# Patient Record
Sex: Male | Born: 1975
Health system: Southern US, Community
[De-identification: ages and names within clinical notes are randomized; demographics above are authoritative.]

## PROBLEM LIST (undated history)

## (undated) DIAGNOSIS — J381 Polyp of vocal cord and larynx: Secondary | ICD-10-CM

## (undated) DIAGNOSIS — F419 Anxiety disorder, unspecified: Secondary | ICD-10-CM

## (undated) HISTORY — DX: Polyp of vocal cord and larynx: J38.1

## (undated) HISTORY — DX: Anxiety disorder, unspecified: F41.9

## (undated) HISTORY — PX: FOOT FRACTURE SURGERY: SHX645

## (undated) HISTORY — PX: MINOR HEMORRHOIDECTOMY: SHX6238

## (undated) DEATH — deceased

---

## 2001-07-05 ENCOUNTER — Encounter: Payer: Self-pay | Admitting: Emergency Medicine

## 2001-07-05 ENCOUNTER — Emergency Department (HOSPITAL_COMMUNITY): Admission: EM | Admit: 2001-07-05 | Discharge: 2001-07-05 | Payer: Self-pay | Admitting: Emergency Medicine

## 2001-09-11 ENCOUNTER — Emergency Department (HOSPITAL_COMMUNITY): Admission: EM | Admit: 2001-09-11 | Discharge: 2001-09-11 | Payer: Self-pay | Admitting: Emergency Medicine

## 2001-09-11 ENCOUNTER — Encounter: Payer: Self-pay | Admitting: Emergency Medicine

## 2001-09-30 ENCOUNTER — Emergency Department (HOSPITAL_COMMUNITY): Admission: EM | Admit: 2001-09-30 | Discharge: 2001-10-01 | Payer: Self-pay | Admitting: *Deleted

## 2001-10-01 ENCOUNTER — Encounter: Payer: Self-pay | Admitting: *Deleted

## 2001-10-02 ENCOUNTER — Emergency Department (HOSPITAL_COMMUNITY): Admission: EM | Admit: 2001-10-02 | Discharge: 2001-10-02 | Payer: Self-pay | Admitting: Emergency Medicine

## 2001-10-07 ENCOUNTER — Emergency Department (HOSPITAL_COMMUNITY): Admission: EM | Admit: 2001-10-07 | Discharge: 2001-10-08 | Payer: Self-pay | Admitting: Emergency Medicine

## 2001-10-11 ENCOUNTER — Encounter: Payer: Self-pay | Admitting: Internal Medicine

## 2001-10-11 ENCOUNTER — Ambulatory Visit (HOSPITAL_COMMUNITY): Admission: RE | Admit: 2001-10-11 | Discharge: 2001-10-11 | Payer: Self-pay | Admitting: Internal Medicine

## 2001-10-18 ENCOUNTER — Encounter (HOSPITAL_COMMUNITY): Admission: RE | Admit: 2001-10-18 | Discharge: 2001-11-17 | Payer: Self-pay | Admitting: Internal Medicine

## 2002-04-08 ENCOUNTER — Emergency Department (HOSPITAL_COMMUNITY): Admission: EM | Admit: 2002-04-08 | Discharge: 2002-04-08 | Payer: Self-pay | Admitting: Emergency Medicine

## 2002-04-08 ENCOUNTER — Encounter: Payer: Self-pay | Admitting: Emergency Medicine

## 2002-04-27 ENCOUNTER — Emergency Department (HOSPITAL_COMMUNITY): Admission: EM | Admit: 2002-04-27 | Discharge: 2002-04-27 | Payer: Self-pay | Admitting: Emergency Medicine

## 2002-10-31 ENCOUNTER — Emergency Department (HOSPITAL_COMMUNITY): Admission: EM | Admit: 2002-10-31 | Discharge: 2002-10-31 | Payer: Self-pay | Admitting: Emergency Medicine

## 2003-09-13 ENCOUNTER — Emergency Department (HOSPITAL_COMMUNITY): Admission: EM | Admit: 2003-09-13 | Discharge: 2003-09-13 | Payer: Self-pay | Admitting: Emergency Medicine

## 2004-08-02 ENCOUNTER — Emergency Department (HOSPITAL_COMMUNITY): Admission: EM | Admit: 2004-08-02 | Discharge: 2004-08-02 | Payer: Self-pay | Admitting: Emergency Medicine

## 2005-05-26 ENCOUNTER — Emergency Department (HOSPITAL_COMMUNITY): Admission: EM | Admit: 2005-05-26 | Discharge: 2005-05-26 | Payer: Self-pay | Admitting: Emergency Medicine

## 2005-05-28 ENCOUNTER — Emergency Department (HOSPITAL_COMMUNITY): Admission: EM | Admit: 2005-05-28 | Discharge: 2005-05-28 | Payer: Self-pay | Admitting: Emergency Medicine

## 2006-02-17 ENCOUNTER — Emergency Department (HOSPITAL_COMMUNITY): Admission: EM | Admit: 2006-02-17 | Discharge: 2006-02-17 | Payer: Self-pay | Admitting: Family Medicine

## 2006-07-12 ENCOUNTER — Emergency Department (HOSPITAL_COMMUNITY): Admission: EM | Admit: 2006-07-12 | Discharge: 2006-07-12 | Payer: Self-pay | Admitting: Emergency Medicine

## 2006-07-17 ENCOUNTER — Ambulatory Visit (HOSPITAL_COMMUNITY): Admission: RE | Admit: 2006-07-17 | Discharge: 2006-07-17 | Payer: Self-pay | Admitting: Family Medicine

## 2006-10-05 ENCOUNTER — Emergency Department (HOSPITAL_COMMUNITY): Admission: EM | Admit: 2006-10-05 | Discharge: 2006-10-05 | Payer: Self-pay | Admitting: Emergency Medicine
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
## 2008-11-26 ENCOUNTER — Encounter: Payer: Self-pay | Admitting: Gastroenterology

## 2009-04-25 HISTORY — PX: FEMUR FRACTURE SURGERY: SHX633

## 2010-02-02 ENCOUNTER — Ambulatory Visit: Payer: Self-pay | Admitting: Physical Medicine & Rehabilitation

## 2010-02-02 ENCOUNTER — Inpatient Hospital Stay (HOSPITAL_COMMUNITY)
Admission: RE | Admit: 2010-02-02 | Discharge: 2010-02-10 | Payer: Self-pay | Admitting: Physical Medicine & Rehabilitation

## 2010-03-29 ENCOUNTER — Ambulatory Visit: Payer: Self-pay | Admitting: Orthopedic Surgery

## 2010-03-29 DIAGNOSIS — M5430 Sciatica, unspecified side: Secondary | ICD-10-CM

## 2010-03-29 DIAGNOSIS — M25569 Pain in unspecified knee: Secondary | ICD-10-CM | POA: Insufficient documentation

## 2010-03-29 DIAGNOSIS — S72309A Unspecified fracture of shaft of unspecified femur, initial encounter for closed fracture: Secondary | ICD-10-CM

## 2010-04-30 ENCOUNTER — Encounter (HOSPITAL_COMMUNITY)
Admission: RE | Admit: 2010-04-30 | Discharge: 2010-05-25 | Payer: Self-pay | Source: Home / Self Care | Attending: Obstetrics and Gynecology | Admitting: Obstetrics and Gynecology

## 2010-05-05 ENCOUNTER — Ambulatory Visit
Admission: RE | Admit: 2010-05-05 | Discharge: 2010-05-05 | Payer: Self-pay | Source: Home / Self Care | Attending: Orthopedic Surgery | Admitting: Orthopedic Surgery

## 2010-05-18 ENCOUNTER — Encounter: Payer: Self-pay | Admitting: Orthopedic Surgery

## 2010-05-24 ENCOUNTER — Encounter: Payer: Self-pay | Admitting: Gastroenterology

## 2010-05-27 ENCOUNTER — Encounter (HOSPITAL_COMMUNITY)
Admission: RE | Admit: 2010-05-27 | Discharge: 2010-05-27 | Disposition: A | Discharge status: Home / Self Care | Payer: 59 | Source: Ambulatory Visit | Attending: Rehabilitation | Admitting: Rehabilitation

## 2010-05-27 DIAGNOSIS — R29898 Other symptoms and signs involving the musculoskeletal system: Secondary | ICD-10-CM | POA: Insufficient documentation

## 2010-05-27 DIAGNOSIS — R262 Difficulty in walking, not elsewhere classified: Secondary | ICD-10-CM | POA: Insufficient documentation

## 2010-05-27 DIAGNOSIS — S7290XD Unspecified fracture of unspecified femur, subsequent encounter for closed fracture with routine healing: Secondary | ICD-10-CM | POA: Insufficient documentation

## 2010-05-27 DIAGNOSIS — IMO0001 Reserved for inherently not codable concepts without codable children: Secondary | ICD-10-CM | POA: Insufficient documentation
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
## 2010-05-27 NOTE — Miscellaneous (Signed)
Summary: Pt cl;inical evaluation  Pt cl;inical evaluation   Imported By: Jacklynn Ganong 05/21/2010 12:24:31  _____________________________________________________________________  External Attachment:    Type:   Image     Comment:   External Document

## 2010-05-27 NOTE — Letter (Signed)
Summary: History form  History form   Imported By: Jacklynn Ganong 03/31/2010 13:25:28  _____________________________________________________________________  External Attachment:    Type:   Image     Comment:   External Document
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
## 2010-05-31 ENCOUNTER — Ambulatory Visit (HOSPITAL_COMMUNITY)
Admission: RE | Admit: 2010-05-31 | Discharge: 2010-05-31 | Disposition: A | Discharge status: Home / Self Care | Payer: 59 | Source: Ambulatory Visit | Attending: Orthopedic Surgery | Admitting: Orthopedic Surgery

## 2010-05-31 DIAGNOSIS — R269 Unspecified abnormalities of gait and mobility: Secondary | ICD-10-CM | POA: Insufficient documentation

## 2010-05-31 DIAGNOSIS — M6281 Muscle weakness (generalized): Secondary | ICD-10-CM | POA: Insufficient documentation

## 2010-05-31 DIAGNOSIS — IMO0001 Reserved for inherently not codable concepts without codable children: Secondary | ICD-10-CM | POA: Insufficient documentation

## 2010-06-02 NOTE — Letter (Signed)
Summary: refill request  refill request   Imported By: Ave Filter 05/24/2010 16:13:45  _____________________________________________________________________  External Attachment:    Type:   Image     Comment:   External Document

## 2010-06-04 ENCOUNTER — Ambulatory Visit (HOSPITAL_COMMUNITY)
Admission: RE | Admit: 2010-06-04 | Discharge: 2010-06-04 | Disposition: A | Discharge status: Home / Self Care | Payer: 59 | Source: Ambulatory Visit | Attending: Rehabilitation | Admitting: Rehabilitation

## 2010-06-04 DIAGNOSIS — R262 Difficulty in walking, not elsewhere classified: Secondary | ICD-10-CM | POA: Insufficient documentation

## 2010-06-04 DIAGNOSIS — IMO0001 Reserved for inherently not codable concepts without codable children: Secondary | ICD-10-CM | POA: Insufficient documentation

## 2010-06-04 DIAGNOSIS — M79609 Pain in unspecified limb: Secondary | ICD-10-CM | POA: Insufficient documentation

## 2010-06-04 DIAGNOSIS — M6281 Muscle weakness (generalized): Secondary | ICD-10-CM | POA: Insufficient documentation

## 2010-06-04 DIAGNOSIS — M25659 Stiffness of unspecified hip, not elsewhere classified: Secondary | ICD-10-CM | POA: Insufficient documentation

## 2010-06-04 DIAGNOSIS — R269 Unspecified abnormalities of gait and mobility: Secondary | ICD-10-CM | POA: Insufficient documentation

## 2010-06-09 ENCOUNTER — Ambulatory Visit (HOSPITAL_COMMUNITY)
Admission: RE | Admit: 2010-06-09 | Discharge: 2010-06-09 | Disposition: A | Discharge status: Home / Self Care | Payer: 59 | Source: Ambulatory Visit | Attending: Orthopedic Surgery | Admitting: Orthopedic Surgery

## 2010-06-09 DIAGNOSIS — R262 Difficulty in walking, not elsewhere classified: Secondary | ICD-10-CM | POA: Insufficient documentation

## 2010-06-09 DIAGNOSIS — M6281 Muscle weakness (generalized): Secondary | ICD-10-CM | POA: Insufficient documentation

## 2010-06-09 DIAGNOSIS — M79609 Pain in unspecified limb: Secondary | ICD-10-CM | POA: Insufficient documentation

## 2010-06-09 DIAGNOSIS — IMO0001 Reserved for inherently not codable concepts without codable children: Secondary | ICD-10-CM | POA: Insufficient documentation

## 2010-06-10 ENCOUNTER — Ambulatory Visit (HOSPITAL_COMMUNITY)
Admission: RE | Admit: 2010-06-10 | Discharge: 2010-06-10 | Disposition: A | Discharge status: Home / Self Care | Payer: 59 | Source: Ambulatory Visit | Attending: *Deleted | Admitting: *Deleted

## 2010-06-10 ENCOUNTER — Encounter: Payer: Self-pay | Admitting: Orthopedic Surgery

## 2010-06-15 ENCOUNTER — Encounter: Payer: Self-pay | Admitting: Urgent Care

## 2010-06-16 ENCOUNTER — Ambulatory Visit (HOSPITAL_COMMUNITY): Payer: 59 | Admitting: Physical Therapy

## 2010-06-16 ENCOUNTER — Ambulatory Visit: Payer: Self-pay | Admitting: Orthopedic Surgery

## 2010-06-18 ENCOUNTER — Ambulatory Visit (HOSPITAL_COMMUNITY)
Admission: RE | Admit: 2010-06-18 | Discharge: 2010-06-18 | Disposition: A | Discharge status: Home / Self Care | Payer: 59 | Source: Ambulatory Visit

## 2010-06-21 ENCOUNTER — Ambulatory Visit (HOSPITAL_COMMUNITY)
Admission: RE | Admit: 2010-06-21 | Discharge: 2010-06-21 | Disposition: A | Discharge status: Home / Self Care | Payer: 59 | Source: Ambulatory Visit | Attending: *Deleted | Admitting: *Deleted

## 2010-06-22 ENCOUNTER — Ambulatory Visit (HOSPITAL_COMMUNITY)
Admission: RE | Admit: 2010-06-22 | Discharge: 2010-06-22 | Disposition: A | Discharge status: Home / Self Care | Payer: 59 | Source: Ambulatory Visit | Attending: *Deleted | Admitting: *Deleted

## 2010-06-22 NOTE — Medication Information (Signed)
Summary: CYCLOBENZAPRINE 10MG   CYCLOBENZAPRINE 10MG    Imported By: Rexene Alberts 06/15/2010 09:22:56  _____________________________________________________________________  External Attachment:    Type:   Image     Comment:   External Document  Appended Document: CYCLOBENZAPRINE 10MG  Please call pharm.  I think this should go to Dr Harrison's maybe?  Appended Document: CYCLOBENZAPRINE 10MG  informed pharmacy

## 2010-06-22 NOTE — Miscellaneous (Signed)
Summary: Rehab Report  Rehab Report   Imported By: Cammie Sickle 06/16/2010 16:08:07  _____________________________________________________________________  External Attachment:    Type:   Image     Comment:   External Document

## 2010-06-24 ENCOUNTER — Encounter: Payer: Self-pay | Admitting: Orthopedic Surgery

## 2010-06-28 ENCOUNTER — Encounter: Payer: Self-pay | Admitting: Orthopedic Surgery

## 2010-06-28 ENCOUNTER — Ambulatory Visit (INDEPENDENT_AMBULATORY_CARE_PROVIDER_SITE_OTHER): Payer: 59 | Admitting: Orthopedic Surgery

## 2010-06-28 DIAGNOSIS — S72309A Unspecified fracture of shaft of unspecified femur, initial encounter for closed fracture: Secondary | ICD-10-CM

## 2010-06-30 ENCOUNTER — Ambulatory Visit: Payer: Self-pay | Admitting: Orthopedic Surgery

## 2010-07-05 ENCOUNTER — Ambulatory Visit (HOSPITAL_COMMUNITY)
Admission: RE | Admit: 2010-07-05 | Discharge: 2010-07-05 | Disposition: A | Discharge status: Home / Self Care | Payer: 59 | Source: Ambulatory Visit | Attending: Rehabilitation | Admitting: Rehabilitation

## 2010-07-05 DIAGNOSIS — M79609 Pain in unspecified limb: Secondary | ICD-10-CM | POA: Insufficient documentation

## 2010-07-05 DIAGNOSIS — M6281 Muscle weakness (generalized): Secondary | ICD-10-CM | POA: Insufficient documentation

## 2010-07-05 DIAGNOSIS — IMO0001 Reserved for inherently not codable concepts without codable children: Secondary | ICD-10-CM | POA: Insufficient documentation

## 2010-07-05 DIAGNOSIS — R262 Difficulty in walking, not elsewhere classified: Secondary | ICD-10-CM | POA: Insufficient documentation
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
## 2010-07-06 NOTE — Miscellaneous (Signed)
Summary: Dr. Dwana Melena  Treating physician at [facility]  Appended Document: Dr. Dwana Melena [phone]

## 2010-07-07 ENCOUNTER — Ambulatory Visit (HOSPITAL_COMMUNITY)
Admission: RE | Admit: 2010-07-07 | Discharge: 2010-07-07 | Disposition: A | Discharge status: Home / Self Care | Payer: 59 | Source: Ambulatory Visit | Attending: *Deleted | Admitting: *Deleted

## 2010-07-07 LAB — CULTURE, BLOOD (ROUTINE X 2): Culture  Setup Time: 201110120532

## 2010-07-07 LAB — COMPREHENSIVE METABOLIC PANEL
Alkaline Phosphatase: 88 U/L (ref 39–117)
CO2: 26 mEq/L (ref 19–32)
Calcium: 9.1 mg/dL (ref 8.4–10.5)
GFR calc Af Amer: >60 mL/min (ref >60)
Sodium: 134 mEq/L — ABNORMAL LOW (ref 135–145)
Total Bilirubin: 1.2 mg/dL (ref 0.3–1.2)

## 2010-07-07 LAB — URINE CULTURE
Colony Count: 60000
Culture  Setup Time: 201110120959

## 2010-07-07 LAB — HEPATIC FUNCTION PANEL
ALT: 96 U/L — ABNORMAL HIGH (ref 0–53)
AST: 46 U/L — ABNORMAL HIGH (ref 0–37)
Albumin: 3.1 g/dL — ABNORMAL LOW (ref 3.5–5.2)

## 2010-07-07 LAB — CBC
Hemoglobin: 10 g/dL — ABNORMAL LOW (ref 13.0–17.0)
MCHC: 33.3 g/dL (ref 30.0–36.0)
RBC: 3.36 MIL/uL — ABNORMAL LOW (ref 4.22–5.81)

## 2010-07-07 LAB — URINALYSIS, MICROSCOPIC ONLY
Bilirubin Urine: NEGATIVE
Glucose, UA: NEGATIVE mg/dL
Ketones, ur: NEGATIVE mg/dL
pH: 6.5 (ref 5.0–8.0)

## 2010-07-07 LAB — DIFFERENTIAL
Basophils Absolute: 0 10*3/uL (ref 0.0–0.1)
Basophils Relative: 0 % (ref 0–1)
Lymphocytes Relative: 23 % (ref 12–46)
Neutro Abs: 6.8 10*3/uL (ref 1.7–7.7)
Neutrophils Relative %: 68 % (ref 43–77)

## 2010-07-26 ENCOUNTER — Ambulatory Visit (HOSPITAL_COMMUNITY)
Admission: RE | Admit: 2010-07-26 | Discharge: 2010-07-26 | Disposition: A | Discharge status: Home / Self Care | Payer: 59 | Source: Ambulatory Visit | Attending: Rehabilitation | Admitting: Rehabilitation

## 2010-07-26 DIAGNOSIS — M79609 Pain in unspecified limb: Secondary | ICD-10-CM | POA: Insufficient documentation

## 2010-07-26 DIAGNOSIS — R262 Difficulty in walking, not elsewhere classified: Secondary | ICD-10-CM | POA: Insufficient documentation

## 2010-07-26 DIAGNOSIS — IMO0001 Reserved for inherently not codable concepts without codable children: Secondary | ICD-10-CM | POA: Insufficient documentation

## 2010-07-26 DIAGNOSIS — M6281 Muscle weakness (generalized): Secondary | ICD-10-CM | POA: Insufficient documentation

## 2010-08-02 ENCOUNTER — Ambulatory Visit (HOSPITAL_COMMUNITY)
Admission: RE | Admit: 2010-08-02 | Discharge: 2010-08-02 | Disposition: A | Discharge status: Home / Self Care | Payer: 59 | Source: Ambulatory Visit | Attending: *Deleted | Admitting: *Deleted

## 2010-08-03 ENCOUNTER — Ambulatory Visit (INDEPENDENT_AMBULATORY_CARE_PROVIDER_SITE_OTHER): Payer: 59 | Admitting: Orthopedic Surgery

## 2010-08-03 DIAGNOSIS — S72309A Unspecified fracture of shaft of unspecified femur, initial encounter for closed fracture: Secondary | ICD-10-CM

## 2010-08-03 NOTE — Patient Instructions (Signed)
Take ibuprofen 800 mg up to 3 x a day

## 2010-08-03 NOTE — Discharge Summary (Signed)
Identifiable x-ray report.  AP, lateral on fluoroscopy films, RIGHT femur.  Reason for x-ray followup for RIGHT femur fracture with intramedullary nailing.  Previous films are available for comparison.  There is a midshaft femur fracture with callous. It appears to be consolidating. The proximal and distal screws have not migrated and are in good position. Overall alignment near anatomic.  Impression healing midshaft femur fracture

## 2010-08-03 NOTE — Progress Notes (Signed)
6 status post IM nail, RIGHT femur.  Complains of mild pain in his back, hip, and knee.  He says he is now only taking Tylenol with an occasional narcotic. He says he felt like he may have been getting dependent on them and stop taking and thought he might have had some withdrawal symptoms.  He also stopped taking the Neurontin.  He tried to do some running, which didn't go to well. He is Progressing well in physical therapy. He still has a slight limp.  He is tender over his lower back and also over the greater trochanter of the RIGHT hip. He maintains his good knee flexion and good quadriceps muscle tone.  Radiographs were obtained. There is fracture callus around the fracture. The fracture is not completely consolidated yet.  Recommend continued weightbearing, stopped running. Progress to walking 30 minutes consecutive. The elliptical trainer is okay.  I did talk to his physician, surgeon after his last visit, and she said that dynamization is a possibility, if needed at 9 months.  Return in 3 months x-rays repeat.

## 2010-08-05 ENCOUNTER — Ambulatory Visit (HOSPITAL_COMMUNITY): Payer: 59 | Admitting: Physical Therapy

## 2010-08-09 ENCOUNTER — Ambulatory Visit (HOSPITAL_COMMUNITY)
Admission: RE | Admit: 2010-08-09 | Discharge: 2010-08-09 | Disposition: A | Discharge status: Home / Self Care | Payer: 59 | Source: Ambulatory Visit | Attending: Rehabilitation | Admitting: Rehabilitation

## 2010-08-11 ENCOUNTER — Ambulatory Visit (HOSPITAL_COMMUNITY): Payer: 59 | Admitting: *Deleted

## 2010-08-13 ENCOUNTER — Ambulatory Visit (HOSPITAL_COMMUNITY)
Admission: RE | Admit: 2010-08-13 | Discharge: 2010-08-13 | Disposition: A | Discharge status: Home / Self Care | Payer: 59 | Source: Ambulatory Visit | Attending: Rehabilitation | Admitting: Rehabilitation

## 2010-08-13 DIAGNOSIS — IMO0001 Reserved for inherently not codable concepts without codable children: Secondary | ICD-10-CM | POA: Insufficient documentation

## 2010-08-13 DIAGNOSIS — M6281 Muscle weakness (generalized): Secondary | ICD-10-CM | POA: Insufficient documentation

## 2010-08-13 DIAGNOSIS — R262 Difficulty in walking, not elsewhere classified: Secondary | ICD-10-CM | POA: Insufficient documentation

## 2010-08-13 DIAGNOSIS — M79609 Pain in unspecified limb: Secondary | ICD-10-CM | POA: Insufficient documentation

## 2010-08-16 ENCOUNTER — Ambulatory Visit (HOSPITAL_COMMUNITY)
Admission: RE | Admit: 2010-08-16 | Discharge: 2010-08-16 | Disposition: A | Discharge status: Home / Self Care | Payer: 59 | Source: Ambulatory Visit | Attending: Rehabilitation | Admitting: Rehabilitation

## 2010-11-09 ENCOUNTER — Ambulatory Visit (INDEPENDENT_AMBULATORY_CARE_PROVIDER_SITE_OTHER): Payer: 59 | Admitting: Orthopedic Surgery

## 2010-11-09 DIAGNOSIS — S72309A Unspecified fracture of shaft of unspecified femur, initial encounter for closed fracture: Secondary | ICD-10-CM

## 2010-11-09 NOTE — Progress Notes (Signed)
   Fracture care, followup.  The patient is doing well, has a mid femur, aching, which is occasional.  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 He is anxious to start some jogging.  X-rays today show fracture bridging across the fracture site on both views. Pretty much healed femur fracture. At this point.  Exam shows excellent hip flexion, knee flexion, and stable. Knee exam. No tenderness to palpation at the fracture site.  Recommend followup for one year x-ray if things are going well at that time, we should be able to stop.  Separate identifiable. X-ray report AP, lateral, RIGHT femur took 4 views. Bridging callus is noted over the previously noted, femur fracture, healing is good alignment is good. Hardware is in good position.  Impression healing femur fracture.

## 2010-11-09 NOTE — Progress Notes (Signed)
Separate identifiable. X-ray report AP, lateral, RIGHT femur took 4 views. Bridging callus is noted over the previously noted, femur fracture, healing is good alignment is good. Hardware is in good position.  Impression healing femur fracture.

## 2010-12-07 ENCOUNTER — Other Ambulatory Visit: Payer: Self-pay | Admitting: *Deleted

## 2010-12-07 ENCOUNTER — Other Ambulatory Visit: Payer: Self-pay

## 2010-12-07 DIAGNOSIS — M791 Myalgia, unspecified site: Secondary | ICD-10-CM

## 2010-12-07 MED ORDER — CYCLOBENZAPRINE HCL 10 MG PO TABS: 10.0000 mg | ORAL_TABLET | Freq: Three times a day (TID) | ORAL | Status: AC | PRN

## 2011-02-10 ENCOUNTER — Ambulatory Visit (INDEPENDENT_AMBULATORY_CARE_PROVIDER_SITE_OTHER): Payer: 59 | Admitting: Orthopedic Surgery

## 2011-02-10 ENCOUNTER — Encounter: Payer: Self-pay | Admitting: Orthopedic Surgery

## 2011-02-10 VITALS — BP 130/64 | Ht 75.5 in | Wt 262.4 lb

## 2011-02-10 DIAGNOSIS — S72309A Unspecified fracture of shaft of unspecified femur, initial encounter for closed fracture: Secondary | ICD-10-CM

## 2011-02-10 NOTE — Patient Instructions (Signed)
Gradually increase activity as tolerated

## 2011-02-11 ENCOUNTER — Encounter: Payer: Self-pay | Admitting: Orthopedic Surgery
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
## 2011-12-07 ENCOUNTER — Telehealth: Payer: Self-pay | Admitting: Gastroenterology

## 2011-12-07 MED ORDER — HYDROCORTISONE ACETATE 25 MG RE SUPP: 25.0000 mg | Freq: Two times a day (BID) | RECTAL | Status: AC

## 2011-12-07 NOTE — Telephone Encounter (Signed)
PT HAVING RECTAL BLEEDING. REQUESTING SUPPOSITORIES.

## 2011-12-21 ENCOUNTER — Other Ambulatory Visit: Payer: Self-pay | Admitting: Gastroenterology

## 2011-12-21 ENCOUNTER — Telehealth: Payer: Self-pay | Admitting: Gastroenterology

## 2011-12-21 DIAGNOSIS — K625 Hemorrhage of anus and rectum: Secondary | ICD-10-CM

## 2011-12-21 DIAGNOSIS — K649 Unspecified hemorrhoids: Secondary | ICD-10-CM

## 2011-12-21 NOTE — Telephone Encounter (Signed)
REVIEWED.  

## 2011-12-21 NOTE — Telephone Encounter (Signed)
PT USING ANUSOL SUPP AND CONTINUE W/ BRBPR AND ITCHING. DESIRES HEMORRHOID BANDING. SCHEDULE FLEX SIG/BANDING 9/3 AROUND 10 AM. NEEDS HEPLOCK ONLY.

## 2011-12-21 NOTE — Telephone Encounter (Signed)
PATIENT IS SCHEDULED AT 10:15 W/SLF ON 09/03

## 2011-12-22 ENCOUNTER — Encounter (HOSPITAL_COMMUNITY): Payer: Self-pay | Admitting: Pharmacy Technician

## 2011-12-22 ENCOUNTER — Other Ambulatory Visit: Payer: Self-pay | Admitting: Gastroenterology

## 2011-12-27 ENCOUNTER — Encounter (HOSPITAL_COMMUNITY): Admission: RE | Payer: Self-pay | Source: Ambulatory Visit

## 2011-12-27 ENCOUNTER — Ambulatory Visit (HOSPITAL_COMMUNITY): Admission: RE | Admit: 2011-12-27 | Payer: 59 | Source: Ambulatory Visit | Admitting: Gastroenterology

## 2011-12-27 SURGERY — SIGMOIDOSCOPY, FLEXIBLE
Anesthesia: Moderate Sedation

## 2013-10-01 ENCOUNTER — Telehealth: Payer: Self-pay | Admitting: Gastroenterology

## 2013-10-01 MED ORDER — NYSTATIN 100000 UNIT/ML MT SUSP: 5.0000 mL | Freq: Four times a day (QID) | OROMUCOSAL | Status: DC

## 2013-10-01 NOTE — Telephone Encounter (Signed)
PT HAVING PROBLEMS WITH GEOGRAPHIC TONGUE. REQUESTING NYASTATIN MW.

## 2014-07-25 NOTE — H&P (Signed)
  NTS SOAP Note  Vital Signs:  Vitals as of: 1/79/1505: Systolic 697: Diastolic 88: Heart Rate 66: Temp 66F: Height 96ft 3in: Weight 268Lbs 0 Ounces: BMI 33.5  BMI : 33.5 kg/m2  Subjective: This 39 year old male presents for of a thrombosed hemorrhoid.  Has been swollen and tender for several days now.  No blood per rectum.  This has been recurrent in nature.  s/p hemorrhoidal banding in the past.  Review of Symptoms:  Constitutional:unremarkable   Head:unremarkable Eyes:unremarkable   Nose/Mouth/Throat:unremarkable Cardiovascular:  unremarkable Respiratory:unremarkable Gastrointestinal:  unremarkable   Genitourinary:unremarkable   Musculoskeletal:unremarkable Skin:unremarkable Hematolgic/Lymphatic:unremarkable   Allergic/Immunologic:unremarkable   Past Medical History:  Reviewed  Past Medical History  Surgical History: banding of internal hemorrhoids 2013, broken femur Medical Problems: none Allergies: nkda Medications: none   Social History:Reviewed  Social History  Preferred Language: English Race:  Black or African American Ethnicity: Not Hispanic / Latino Age: 39 Years 0 Months Marital Status:  M Alcohol: rarely Recreational drug(s):  No   Smoking Status: Never smoker reviewed on 07/24/2014 Functional Status reviewed on 07/24/2014 ------------------------------------------------ Bathing: Normal Cooking: Normal Dressing: Normal Driving: Normal Eating: Normal Managing Meds: Normal Oral Care: Normal Shopping: Normal Toileting: Normal Transferring: Normal Walking: Normal Cognitive Status reviewed on 07/24/2014 ------------------------------------------------ Attention: Normal Decision Making: Normal Language: Normal Memory: Normal Motor: Normal Perception: Normal Problem Solving: Normal Visual and Spatial: Normal   Family History:Reviewed  Family Health History Family History is Unknown    Objective  Information: General:Well appearing, well nourished in no distress. Heart:RRR, no murmur Lungs:  CTA bilaterally, no wheezes, rhonchi, rales.  Breathing unlabored. Thrombosed external hemorrhoid noted at 8 o'clock position,  associated skin tag also present.  Assessment:Thrombosed external hemorrhoid  Diagnoses: 455.4  K64.5 Thrombosed external hemorrhoids (Perianal venous thrombosis)  Procedures: 94801 - UNLISTED EVALUATION AND MANAGEMENT SERVICE    Plan:  Scheduled for hemorrhoidectomy on 08/04/14.   Patient Education:Alternative treatments to surgery were discussed with patient (and family).  Risks and benefits  of procedure were fully explained to the patient (and family) who gave informed consent. Patient/family questions were addressed.  Follow-up:Pending Surgery

## 2014-07-29 NOTE — Patient Instructions (Signed)
Gregory Baker  07/29/2014   Your procedure is scheduled on:  08/04/2014  Report to [facility] at 6:15 AM.  Call this number if you have problems the morning of surgery: [phone]   Remember:   Do not eat food or drink liquids after midnight.   Take these medicines the morning of surgery with A SIP OF WATER: N/A   Do not wear jewelry, make-up or nail polish.  Do not wear lotions, powders, or perfumes. You may wear deodorant.  Do not shave 48 hours prior to surgery. Men may shave face and neck.  Do not bring valuables to the hospital.  [facility] is not responsible for any belongings or valuables.               Contacts, dentures or bridgework may not be worn into surgery.  Leave suitcase in the car. After surgery it may be brought to your room.  For patients admitted to the hospital, discharge time is determined by your treatment team.               Patients discharged the day of surgery will not be allowed to drive home.  Name and phone number of your driver:   Special Instructions: Shower using CHG 2 nights before surgery and the night before surgery.  If you shower the day of surgery use CHG.  Use special wash - you have one bottle of CHG for all showers.  You should use approximately 1/3 of the bottle for each shower.   Please read over the following fact sheets that you were given: Surgical Site Infection Prevention and Anesthesia Post-op Instructions   PATIENT INSTRUCTIONS POST-ANESTHESIA  IMMEDIATELY FOLLOWING SURGERY:  Do not drive or operate machinery for the first twenty four hours after surgery.  Do not make any important decisions for twenty four hours after surgery or while taking narcotic pain medications or sedatives.  If you develop intractable nausea and vomiting or a severe headache please notify your doctor immediately.  FOLLOW-UP:  Please make an appointment with your surgeon as instructed. You do not need to follow up with anesthesia unless specifically  instructed to do so.  WOUND CARE INSTRUCTIONS (if applicable):  Keep a dry clean dressing on the anesthesia/puncture wound site if there is drainage.  Once the wound has quit draining you may leave it open to air.  Generally you should leave the bandage intact for twenty four hours unless there is drainage.  If the epidural site drains for more than 36-48 hours please call the anesthesia department.  QUESTIONS?:  Please feel free to call your physician or the hospital operator if you have any questions, and they will be happy to assist you.      Hemorrhoidectomy Hemorrhoidectomy is surgery to remove hemorrhoids. Hemorrhoids are veins that have become swollen in the rectum. The rectum is the area from the bottom end of the intestines to the opening where bowel movements leave the body. Hemorrhoids can be uncomfortable. They can cause itching, bleeding and pain if a blood clot forms in them (thrombose). If hemorrhoids are small, surgery may not be needed. But if they cover a larger area, surgery is usually suggested.  LET YOUR CAREGIVER KNOW ABOUT:   Any allergies.  All medications you are taking, including:  Herbs, eyedrops, over-the-counter medications and creams.  Blood thinners (anticoagulants), aspirin or other drugs that could affect blood clotting.  Use of steroids (by mouth or as creams).  Previous problems with anesthetics, including  local anesthetics.  Possibility of pregnancy, if this applies.  Any history of blood clots.  Any history of bleeding or other blood problems.  Previous surgery.  Smoking history.  Other health problems. RISKS AND COMPLICATIONS All surgery carries some risk. However, hemorrhoid surgery usually goes smoothly. Possible complications could include:  Urinary retention.  Bleeding.  Infection.  A painful incision.  A reaction to the anesthesia (this is not common). BEFORE THE PROCEDURE   Stop using aspirin and non-steroidal  anti-inflammatory drugs (NSAIDs) for pain relief. This includes prescription drugs and over-the-counter drugs such as ibuprofen and naproxen. Also stop taking vitamin E. If possible, do this two weeks before your surgery.  If you take blood-thinners, ask your healthcare provider when you should stop taking them.  You will probably have blood and urine tests done several days before your surgery.  Do not eat or drink for about 8 hours before the surgery.  Arrive at least an hour before the surgery, or whenever your surgeon recommends. This will give you time to check in and fill out any needed paperwork.  Hemorrhoidectomy is often an outpatient procedure. This means you will be able to go home the same day. Sometimes, though, people stay overnight in the hospital after the procedure. Ask your surgeon what to expect. Either way, make arrangements in advance for someone to drive you home. PROCEDURE   The preparation:  You will change into a hospital gown.  You will be given an IV. A needle will be inserted in your arm. Medication can flow directly into your body through this needle.  You might be given an enema to clear your rectum.  Once in the operating room, you will probably lie on your side or be repositioned later to lying on your stomach.  You will be given anesthesia (medication) so you will not feel anything during the surgery. The surgery often is done with local anesthesia (the area near the hemorrhoids will be numb and you will be drowsy but awake). Sometimes, general anesthesia is used (you will be asleep during the procedure).  The procedure:  There are a few different procedures for hemorrhoids. Be sure to ask you surgeon about the procedure, the risks and benefits.  Be sure to ask about what you need to do to take care of the wound, if there is one. AFTER THE PROCEDURE  You will stay in a recovery area until the anesthesia has worn off. Your blood pressure and pulse will  be checked every so often.  You may feel a lot of pain in the area of the rectum.  Take all pain medication prescribed by your surgeon. Ask before taking any over-the-counter pain medicines.  Sometimes sitting in a warm bath can help relieve your pain.  To make sure you have bowel movements without straining:  You will probably need to take stool softeners (usually a pill) for a few days.  You should drink 8 to 10 glasses of water each day.  Your activity will be restricted for awhile. Ask your caregiver for a list of what you should and should not do while you recover. Document Released: 02/06/2009 Document Revised: 07/04/2011 Document Reviewed: 02/06/2009 [facility] Patient Information 2015 [place], [place]. This information is not intended to replace advice given to you by your health care provider. Make sure you discuss any questions you have with your health care provider.

## 2014-07-31 ENCOUNTER — Encounter (HOSPITAL_COMMUNITY)
Admission: RE | Admit: 2014-07-31 | Discharge: 2014-07-31 | Disposition: A | Discharge status: Home / Self Care | Payer: 59 | Source: Ambulatory Visit | Attending: General Surgery | Admitting: General Surgery

## 2014-07-31 ENCOUNTER — Encounter (HOSPITAL_COMMUNITY): Payer: Self-pay

## 2014-07-31 DIAGNOSIS — Z01812 Encounter for preprocedural laboratory examination: Secondary | ICD-10-CM | POA: Diagnosis not present

## 2014-07-31 LAB — CBC WITH DIFFERENTIAL/PLATELET
Basophils Absolute: 0 10*3/uL (ref 0.0–0.1)
Basophils Relative: 0 % (ref 0–1)
EOS ABS: 0.1 10*3/uL (ref 0.0–0.7)
EOS PCT: 1 % (ref 0–5)
HCT: 44.5 % (ref 39.0–52.0)
Hemoglobin: 14.9 g/dL (ref 13.0–17.0)
Lymphocytes Relative: 39 % (ref 12–46)
Lymphs Abs: 2.3 10*3/uL (ref 0.7–4.0)
MCH: 30.7 pg (ref 26.0–34.0)
MCHC: 33.5 g/dL (ref 30.0–36.0)
MCV: 91.6 fL (ref 78.0–100.0)
MONOS PCT: 8 % (ref 3–12)
Monocytes Absolute: 0.5 10*3/uL (ref 0.1–1.0)
NEUTROS ABS: 3.1 10*3/uL (ref 1.7–7.7)
Neutrophils Relative %: 52 % (ref 43–77)
PLATELETS: 237 10*3/uL (ref 150–400)
RBC: 4.86 MIL/uL (ref 4.22–5.81)
RDW: 12.2 % (ref 11.5–15.5)
WBC: 6 10*3/uL (ref 4.0–10.5)

## 2014-07-31 LAB — BASIC METABOLIC PANEL
Anion gap: 7 (ref 5–15)
BUN: 16 mg/dL (ref 6–23)
CHLORIDE: 104 mmol/L (ref 96–112)
CO2: 25 mmol/L (ref 19–32)
Calcium: 9.5 mg/dL (ref 8.4–10.5)
Creatinine, Ser: 1.27 mg/dL (ref 0.50–1.35)
GFR, EST AFRICAN AMERICAN: 81 mL/min — AB (ref >90)
GFR, EST NON AFRICAN AMERICAN: 70 mL/min — AB (ref >90)
GLUCOSE: 96 mg/dL (ref 70–99)
Potassium: 4.1 mmol/L (ref 3.5–5.1)
Sodium: 136 mmol/L (ref 135–145)

## 2014-08-04 ENCOUNTER — Encounter (HOSPITAL_COMMUNITY): Payer: Self-pay

## 2014-08-04 ENCOUNTER — Encounter (HOSPITAL_COMMUNITY)
Admission: RE | Disposition: A | Discharge status: Home / Self Care | Payer: Self-pay | Source: Ambulatory Visit | Attending: General Surgery

## 2014-08-04 ENCOUNTER — Ambulatory Visit (HOSPITAL_COMMUNITY): Payer: 59 | Admitting: Anesthesiology

## 2014-08-04 ENCOUNTER — Ambulatory Visit (HOSPITAL_COMMUNITY)
Admission: RE | Admit: 2014-08-04 | Discharge: 2014-08-04 | Disposition: A | Discharge status: Home / Self Care | Payer: 59 | Source: Ambulatory Visit | Attending: General Surgery | Admitting: General Surgery

## 2014-08-04 DIAGNOSIS — K645 Perianal venous thrombosis: Secondary | ICD-10-CM | POA: Diagnosis not present

## 2014-08-04 HISTORY — PX: HEMORRHOID SURGERY: SHX153

## 2014-08-04 SURGERY — HEMORRHOIDECTOMY
Anesthesia: General

## 2014-08-04 MED ORDER — OXYCODONE-ACETAMINOPHEN 7.5-325 MG PO TABS: 1.0000 | ORAL_TABLET | ORAL | Status: DC | PRN

## 2014-08-04 MED ORDER — SODIUM CHLORIDE 0.9 % IR SOLN
Status: DC | PRN
  Administered 2014-08-04: 1000 mL

## 2014-08-04 MED ORDER — FENTANYL CITRATE 0.05 MG/ML IJ SOLN
INTRAMUSCULAR | Status: DC | PRN
  Administered 2014-08-04: 100 ug via INTRAVENOUS

## 2014-08-04 MED ORDER — LIDOCAINE HCL (CARDIAC) 10 MG/ML IV SOLN
INTRAVENOUS | Status: DC | PRN
  Administered 2014-08-04: 30 mg via INTRAVENOUS

## 2014-08-04 MED ORDER — GLYCOPYRROLATE 0.2 MG/ML IJ SOLN
0.2000 mg | Freq: Once | INTRAMUSCULAR | Status: AC
  Administered 2014-08-04: 0.2 mg via INTRAVENOUS

## 2014-08-04 MED ORDER — BUPIVACAINE HCL (PF) 0.5 % IJ SOLN
INTRAMUSCULAR | Status: AC
  Filled 2014-08-04: qty 30

## 2014-08-04 MED ORDER — LIDOCAINE HCL (PF) 1 % IJ SOLN
INTRAMUSCULAR | Status: AC
  Filled 2014-08-04: qty 5

## 2014-08-04 MED ORDER — MIDAZOLAM HCL 2 MG/2ML IJ SOLN
1.0000 mg | INTRAMUSCULAR | Status: DC | PRN
Start: 2014-08-04 — End: 2014-08-04
  Administered 2014-08-04: 2 mg via INTRAVENOUS

## 2014-08-04 MED ORDER — PROPOFOL 10 MG/ML IV BOLUS
INTRAVENOUS | Status: AC
  Filled 2014-08-04: qty 20

## 2014-08-04 MED ORDER — LIDOCAINE VISCOUS 2 % MT SOLN
OROMUCOSAL | Status: DC | PRN
  Administered 2014-08-04: 1

## 2014-08-04 MED ORDER — FENTANYL CITRATE 0.05 MG/ML IJ SOLN
INTRAMUSCULAR | Status: AC
  Filled 2014-08-04: qty 2

## 2014-08-04 MED ORDER — ONDANSETRON HCL 4 MG/2ML IJ SOLN: 4.0000 mg | Freq: Once | INTRAMUSCULAR | Status: DC | PRN

## 2014-08-04 MED ORDER — LACTATED RINGERS IV SOLN
INTRAVENOUS | Status: DC
  Administered 2014-08-04: 07:00:00 via INTRAVENOUS

## 2014-08-04 MED ORDER — KETOROLAC TROMETHAMINE 30 MG/ML IJ SOLN
30.0000 mg | Freq: Once | INTRAMUSCULAR | Status: AC
  Administered 2014-08-04: 30 mg via INTRAVENOUS

## 2014-08-04 MED ORDER — PROPOFOL 10 MG/ML IV BOLUS
INTRAVENOUS | Status: DC | PRN
  Administered 2014-08-04: 200 mg via INTRAVENOUS
  Administered 2014-08-04: 30 mg via INTRAVENOUS
  Administered 2014-08-04: 20 mg via INTRAVENOUS

## 2014-08-04 MED ORDER — METRONIDAZOLE IN NACL 5-0.79 MG/ML-% IV SOLN
500.0000 mg | INTRAVENOUS | Status: AC
  Administered 2014-08-04: 500 mg via INTRAVENOUS
  Filled 2014-08-04: qty 100

## 2014-08-04 MED ORDER — MIDAZOLAM HCL 2 MG/2ML IJ SOLN
INTRAMUSCULAR | Status: AC
  Filled 2014-08-04: qty 2

## 2014-08-04 MED ORDER — LIDOCAINE VISCOUS 2 % MT SOLN
OROMUCOSAL | Status: AC
  Filled 2014-08-04: qty 15

## 2014-08-04 MED ORDER — FENTANYL CITRATE 0.05 MG/ML IJ SOLN
25.0000 ug | INTRAMUSCULAR | Status: DC | PRN
  Administered 2014-08-04 (×4): 50 ug via INTRAVENOUS

## 2014-08-04 MED ORDER — GLYCOPYRROLATE 0.2 MG/ML IJ SOLN
INTRAMUSCULAR | Status: AC
  Filled 2014-08-04: qty 1

## 2014-08-04 MED ORDER — BUPIVACAINE HCL (PF) 0.5 % IJ SOLN
INTRAMUSCULAR | Status: DC | PRN
  Administered 2014-08-04: 10 mL

## 2014-08-04 MED ORDER — ONDANSETRON HCL 4 MG/2ML IJ SOLN
INTRAMUSCULAR | Status: AC
Start: 2014-08-04 — End: 2014-08-04
  Filled 2014-08-04: qty 2

## 2014-08-04 MED ORDER — KETOROLAC TROMETHAMINE 30 MG/ML IJ SOLN
INTRAMUSCULAR | Status: AC
  Filled 2014-08-04: qty 1

## 2014-08-04 MED ORDER — FENTANYL CITRATE 0.05 MG/ML IJ SOLN
25.0000 ug | INTRAMUSCULAR | Status: AC
  Administered 2014-08-04 (×2): 25 ug via INTRAVENOUS

## 2014-08-04 SURGICAL SUPPLY — 34 items
BAG HAMPER (MISCELLANEOUS) ×2 IMPLANT
CLOTH BEACON ORANGE TIMEOUT ST (SAFETY) ×2 IMPLANT
COVER LIGHT HANDLE STERIS (MISCELLANEOUS) ×4 IMPLANT
COVER MAYO STAND XLG (DRAPE) ×2 IMPLANT
DECANTER SPIKE VIAL GLASS SM (MISCELLANEOUS) ×2 IMPLANT
DRAPE PROXIMA HALF (DRAPES) ×2 IMPLANT
ELECT REM PT RETURN 9FT ADLT (ELECTROSURGICAL) ×2
ELECTRODE REM PT RTRN 9FT ADLT (ELECTROSURGICAL) ×1 IMPLANT
FORMALIN 10 PREFIL 120ML (MISCELLANEOUS) ×2 IMPLANT
GAUZE SPONGE 4X4 12PLY STRL (GAUZE/BANDAGES/DRESSINGS) ×2 IMPLANT
GLOVE BIO SURGEON STRL SZ 6.5 (GLOVE) ×1 IMPLANT
GLOVE BIOGEL PI IND STRL 7.0 (GLOVE) IMPLANT
GLOVE BIOGEL PI IND STRL 7.5 (GLOVE) IMPLANT
GLOVE BIOGEL PI INDICATOR 7.0 (GLOVE) ×1
GLOVE BIOGEL PI INDICATOR 7.5 (GLOVE) ×1
GLOVE SURG SS PI 7.5 STRL IVOR (GLOVE) ×4 IMPLANT
GOWN STRL REUS W/ TWL XL LVL3 (GOWN DISPOSABLE) ×1 IMPLANT
GOWN STRL REUS W/TWL LRG LVL3 (GOWN DISPOSABLE) ×2 IMPLANT
GOWN STRL REUS W/TWL XL LVL3 (GOWN DISPOSABLE) ×2
HEMOSTAT SURGICEL 4X8 (HEMOSTASIS) ×2 IMPLANT
KIT ROOM TURNOVER AP CYSTO (KITS) ×2 IMPLANT
LIGASURE IMPACT 36 18CM CVD LR (INSTRUMENTS) ×1 IMPLANT
MANIFOLD NEPTUNE II (INSTRUMENTS) ×2 IMPLANT
NDL HYPO 25X1 1.5 SAFETY (NEEDLE) ×1 IMPLANT
NEEDLE HYPO 25X1 1.5 SAFETY (NEEDLE) ×2 IMPLANT
NS IRRIG 1000ML POUR BTL (IV SOLUTION) ×2 IMPLANT
PACK PERI GYN (CUSTOM PROCEDURE TRAY) ×2 IMPLANT
PAD ARMBOARD 7.5X6 YLW CONV (MISCELLANEOUS) ×2 IMPLANT
SET BASIN LINEN APH (SET/KITS/TRAYS/PACK) ×2 IMPLANT
SPONGE GAUZE 4X4 12PLY STER LF (GAUZE/BANDAGES/DRESSINGS) ×1 IMPLANT
SURGILUBE 3G PEEL PACK STRL (MISCELLANEOUS) ×2 IMPLANT
SUT SILK 0 FSL (SUTURE) ×2 IMPLANT
SUT VIC AB 2-0 CT2 27 (SUTURE) IMPLANT
SYRINGE 10CC LL (SYRINGE) ×2 IMPLANT

## 2014-08-04 NOTE — Anesthesia Procedure Notes (Signed)
Procedure Name: LMA Insertion Date/Time: 08/04/2014 7:52 AM Performed by: Vista Deck Pre-anesthesia Checklist: Patient identified, Patient being monitored, Emergency Drugs available, Timeout performed and Suction available Patient Re-evaluated:Patient Re-evaluated prior to inductionOxygen Delivery Method: Circle System Utilized Preoxygenation: Pre-oxygenation with 100% oxygen Intubation Type: IV induction Ventilation: Mask ventilation without difficulty LMA: LMA inserted LMA Size: 4.0 Number of attempts: 1 Placement Confirmation: positive ETCO2 and breath sounds checked- equal and bilateral Tube secured with: Tape

## 2014-08-04 NOTE — Interval H&P Note (Signed)
History and Physical Interval Note:  08/04/2014 7:11 AM  Gregory Baker  has presented today for surgery, with the diagnosis of external thrombosed hemorrhoid  The various methods of treatment have been discussed with the patient and family. After consideration of risks, benefits and other options for treatment, the patient has consented to  Procedure(s): SIMPLE HEMORRHOIDECTOMY (N/A) as a surgical intervention .  The patient's history has been reviewed, patient examined, no change in status, stable for surgery.  I have reviewed the patient's chart and labs.  Questions were answered to the patient's satisfaction.     Aviva Signs A

## 2014-08-04 NOTE — Discharge Instructions (Signed)
Hemorrhoidectomy Care After Hemorrhoidectomy is the removal of enlarged (dilated) veins around the rectum. Until the surgical areas are healed, control of pain and avoiding constipation are the greatest challenges for patients.  For as long as 24 hours after receiving an anesthetic (the medication that made you sleep), and while taking narcotic pain relievers, you may feel dizzy, weak and drowsy. For that reason, the following information applies to the first 24-hour period following surgery, and continues for as long as you are taking narcotic pain medications.  Do not drive a car, ride a bicycle, participate in activities in which you could be hurt. Do not take public transportation until you are off narcotic pain medications and until your caregiver says it is okay.  Do not drink alcohol, take tranquilizers, or medications not prescribed or allowed by your surgical caregiver.  Do not sign important papers or contracts for at least 24 hours or while taking narcotic medications.  Have a responsible person with you for 24 hours. RISKS AND COMPLICATIONS Some problems that may occur following this procedure include:  Infection. A germ starts growing in the tissue surrounding the site operated on. This can usually be treated with antibiotics.  Damage to the rectal sphincter could occur. This is the muscle that opens in your anus to allow a bowel movement. This could cause incontinence. This is uncommon.  Bleeding following surgery can be a complication of almost any surgery. Your surgeon takes every precaution to keep this from happening.  Complications of anesthesia. HOME CARE INSTRUCTIONS  Avoid straining when having bowel movements.  Avoid heavy lifting (more than 10 pounds (4.5 kilograms)).  Only take over-the-counter or prescription medicines for pain, discomfort, or fever as directed by your caregiver.  Take hot sitz baths for 20 to 30 minutes, 3 to 4 times per day.  To keep  swelling down, apply an ice pack for twenty minutes three to four times per day between sitz baths. Use a towel between your skin and the ice pack. Do not do this if it causes too much discomfort.  Keep anal area clean and dry. Following a bowel movement, you can gently wash the area with tucks (available for purchase at a drugstore) or cotton swabs. Gently pat the area dry. Do not rub the area.  Eat a well balanced diet and drink 6 to 8 glasses of water every day to avoid constipation. A bulk laxative may be also be helpful. SEEK MEDICAL CARE IF:   You have increasing pain or tenderness near or in the surgical site.  You are unable to eat or drink.  You develop nausea or vomiting.  You develop uncontrolled bleeding such as soaking two to three pads in one hour.  You have constipation, not helped by changing your diet or increasing your fluid intake. Pain medications are a common cause of constipation.  You have pain and redness (inflammation) extending outside the area of your surgery.  You develop an unexplained oral temperature above 102 F (38.9 C), or any other signs of infection.  You have any other questions or concerns following surgery. Document Released: 07/02/2003 Document Revised: 07/04/2011 Document Reviewed: 09/29/2008 [facility] Patient Information 2015 [place], [place]. This information is not intended to replace advice given to you by your health care provider. Make sure you discuss any questions you have with your health care provider.

## 2014-08-04 NOTE — Op Note (Signed)
Patient:  Gregory Baker  DOB:  21-Jan-1976  MRN:  [id number]   Preop Diagnosis:  Thrombosed hemorrhoids  Postop Diagnosis:  Same  Procedure:  Extensive hemorrhoidectomy  Surgeon:  Aviva Signs, M.D.  Anes:  Gen.   Indications:  Patient is a 39 year old black male who resents with hemorrhoidal disease. He has a thrombosed external hemorrhoid present on rectal examination. He presents for hemorrhoidectomy. The risks and benefits of the procedure including bleeding, infection, and recurrence of hemorrhoidal disease were fully explained to the patient, who gave informed consent.  Procedure note:  Patient was placed the supine position. After induction of general anesthesia, the perineum was prepped and draped using usual sterile technique with Betadine. Surgical site confirmation was performed.  On rectal examination, the patient had a large thrombosed external hemorrhoid present at the 6 to 7:00 position with an associated internal hemorrhoid. In addition, he had a thrombosed smaller internal hemorrhoid noted at the 8:00 position. Both were removed with the LigaSure without difficulty. Care was taken to avoid the external sphincter mechanism. 0.5% Sensorcaine was instilled the surrounding perineum. Surgicel and Viscous Xylocaine rectal packing was then placed.  All tape and needle counts were correct at the end of the procedure. The patient was awakened and transferred to PACU in stable condition.  Complications:  None  EBL:  Minimal  Specimen:  Hemorrhoids

## 2014-08-04 NOTE — Anesthesia Postprocedure Evaluation (Signed)
Anesthesia Post Note  Patient: Gregory Baker  Procedure(s) Performed: Procedure(s) (LRB): HEMORRHOIDECTOMY (N/A)  Anesthesia type: General  Patient location: PACU  Post pain: Pain level controlled  Post assessment: Post-op Vital signs reviewed, Patient's Cardiovascular Status Stable, Respiratory Function Stable, Patent Airway, No signs of Nausea or vomiting and Pain level controlled  Last Vitals:  Filed Vitals:   08/04/14 0939  BP: 139/98  Pulse: 55  Temp: 36.6 C  Resp: 16    Post vital signs: Reviewed and stable  Level of consciousness: awake and alert   Complications: No apparent anesthesia complications

## 2014-08-04 NOTE — Transfer of Care (Signed)
Immediate Anesthesia Transfer of Care Note  Patient: Gregory Baker  Procedure(s) Performed: Procedure(s): HEMORRHOIDECTOMY (N/A)  Patient Location: PACU  Anesthesia Type:General  Level of Consciousness: sedated and patient cooperative  Airway & Oxygen Therapy: Patient Spontanous Breathing and non-rebreather face mask  Post-op Assessment: Report given to RN, Post -op Vital signs reviewed and stable and Patient moving all extremities  Post vital signs: Reviewed and stable    Complications: No apparent anesthesia complications

## 2014-08-04 NOTE — Anesthesia Preprocedure Evaluation (Signed)
Anesthesia Evaluation  Patient identified by MRN, date of birth, ID band Patient awake    Reviewed: Allergy & Precautions, NPO status , Patient's Chart, lab work & pertinent test results  Airway Mallampati: I  TM Distance: >3 FB     Dental  (+) Teeth Intact, Implants,    Pulmonary neg pulmonary ROS,  breath sounds clear to auscultation        Cardiovascular negative cardio ROS  Rhythm:Regular Rate:Normal     Neuro/Psych    GI/Hepatic negative GI ROS,   Endo/Other    Renal/GU      Musculoskeletal   Abdominal   Peds  Hematology   Anesthesia Other Findings   Reproductive/Obstetrics                             Anesthesia Physical Anesthesia Plan  ASA: I  Anesthesia Plan: General   Post-op Pain Management:    Induction: Intravenous  Airway Management Planned: LMA  Additional Equipment:   Intra-op Plan:   Post-operative Plan: Extubation in OR  Informed Consent: I have reviewed the patients History and Physical, chart, labs and discussed the procedure including the risks, benefits and alternatives for the proposed anesthesia with the patient or authorized representative who has indicated his/her understanding and acceptance.     Plan Discussed with:   Anesthesia Plan Comments:         Anesthesia Quick Evaluation

## 2014-08-05 ENCOUNTER — Encounter (HOSPITAL_COMMUNITY): Payer: Self-pay | Admitting: General Surgery

## 2014-12-22 ENCOUNTER — Emergency Department (HOSPITAL_COMMUNITY): Payer: 59

## 2014-12-22 ENCOUNTER — Encounter (HOSPITAL_COMMUNITY): Payer: Self-pay | Admitting: Emergency Medicine

## 2014-12-22 ENCOUNTER — Observation Stay (HOSPITAL_COMMUNITY)
Admission: EM | Admit: 2014-12-22 | Discharge: 2014-12-22 | Discharge status: Against Medical Advice | Payer: 59 | Attending: Emergency Medicine | Admitting: Emergency Medicine

## 2014-12-22 DIAGNOSIS — Z791 Long term (current) use of non-steroidal anti-inflammatories (NSAID): Secondary | ICD-10-CM | POA: Insufficient documentation

## 2014-12-22 DIAGNOSIS — Z79899 Other long term (current) drug therapy: Secondary | ICD-10-CM | POA: Insufficient documentation

## 2014-12-22 DIAGNOSIS — Z79891 Long term (current) use of opiate analgesic: Secondary | ICD-10-CM | POA: Insufficient documentation

## 2014-12-22 DIAGNOSIS — R079 Chest pain, unspecified: Secondary | ICD-10-CM | POA: Diagnosis not present

## 2014-12-22 DIAGNOSIS — Z5321 Procedure and treatment not carried out due to patient leaving prior to being seen by health care provider: Secondary | ICD-10-CM | POA: Insufficient documentation

## 2014-12-22 DIAGNOSIS — R030 Elevated blood-pressure reading, without diagnosis of hypertension: Secondary | ICD-10-CM | POA: Diagnosis present

## 2014-12-22 LAB — I-STAT CHEM 8, ED
BUN: 19 mg/dL (ref 6–20)
CALCIUM ION: 1.08 mmol/L — AB (ref 1.12–1.23)
Chloride: 100 mmol/L — ABNORMAL LOW (ref 101–111)
Creatinine, Ser: 1.3 mg/dL — ABNORMAL HIGH (ref 0.61–1.24)
Glucose, Bld: 146 mg/dL — ABNORMAL HIGH (ref 65–99)
HCT: 44 % (ref 39.0–52.0)
Hemoglobin: 15 g/dL (ref 13.0–17.0)
Potassium: 3.5 mmol/L (ref 3.5–5.1)
Sodium: 136 mmol/L (ref 135–145)
TCO2: 23 mmol/L (ref 0–100)

## 2014-12-22 LAB — BASIC METABOLIC PANEL
Anion gap: 4 — ABNORMAL LOW (ref 5–15)
BUN: 18 mg/dL (ref 6–20)
CALCIUM: 8.6 mg/dL — AB (ref 8.9–10.3)
CO2: 26 mmol/L (ref 22–32)
CREATININE: 1.34 mg/dL — AB (ref 0.61–1.24)
Chloride: 104 mmol/L (ref 101–111)
GFR calc Af Amer: >60 mL/min (ref >60)
GFR calc non Af Amer: >60 mL/min (ref >60)
GLUCOSE: 147 mg/dL — AB (ref 65–99)
Potassium: 3.6 mmol/L (ref 3.5–5.1)
Sodium: 134 mmol/L — ABNORMAL LOW (ref 135–145)

## 2014-12-22 LAB — CBC
HCT: 43 % (ref 39.0–52.0)
Hemoglobin: 14 g/dL (ref 13.0–17.0)
MCH: 30 pg (ref 26.0–34.0)
MCHC: 32.6 g/dL (ref 30.0–36.0)
MCV: 92.1 fL (ref 78.0–100.0)
PLATELETS: 199 10*3/uL (ref 150–400)
RBC: 4.67 MIL/uL (ref 4.22–5.81)
RDW: 12.2 % (ref 11.5–15.5)
WBC: 6.6 10*3/uL (ref 4.0–10.5)

## 2014-12-22 LAB — I-STAT TROPONIN, ED: Troponin i, poc: 0 ng/mL (ref 0.00–0.08)

## 2014-12-22 MED ORDER — MORPHINE SULFATE (PF) 4 MG/ML IV SOLN
4.0000 mg | Freq: Once | INTRAVENOUS | Status: DC
  Filled 2014-12-22: qty 1

## 2014-12-22 MED ORDER — SODIUM CHLORIDE 0.9 % IV BOLUS (SEPSIS)
1000.0000 mL | Freq: Once | INTRAVENOUS | Status: AC
  Administered 2014-12-22: 1000 mL via INTRAVENOUS

## 2014-12-22 MED ORDER — NITROGLYCERIN 0.4 MG SL SUBL
0.4000 mg | SUBLINGUAL_TABLET | SUBLINGUAL | Status: AC | PRN
  Administered 2014-12-22 (×3): 0.4 mg via SUBLINGUAL
  Filled 2014-12-22: qty 1

## 2014-12-22 MED ORDER — ASPIRIN 325 MG PO TABS
325.0000 mg | ORAL_TABLET | Freq: Once | ORAL | Status: AC
  Administered 2014-12-22: 325 mg via ORAL
  Filled 2014-12-22: qty 1

## 2014-12-22 NOTE — Discharge Instructions (Signed)
Chest Pain (Nonspecific) Mr. Gregory Baker, you were seen for chest pain and your EKG is abnormal. This means you are at risk for a heart attack which is life threatening. You do not want to stay to be evaluated in the hospital.  Please come back to the ED immediately or see your primary care doctor immediately to complete the evaluation of your heart.  Thank you. It is often hard to give a diagnosis for the cause of chest pain. There is always a chance that your pain could be related to something serious, such as a heart attack or a blood clot in the lungs. You need to follow up with your doctor. HOME CARE  If antibiotic medicine was given, take it as directed by your doctor. Finish the medicine even if you start to feel better.  For the next few days, avoid activities that bring on chest pain. Continue physical activities as told by your doctor.  Do not use any tobacco products. This includes cigarettes, chewing tobacco, and e-cigarettes.  Avoid drinking alcohol.  Only take medicine as told by your doctor.  Follow your doctor's suggestions for more testing if your chest pain does not go away.  Keep all doctor visits you made. GET HELP IF:  Your chest pain does not go away, even after treatment.  You have a rash with blisters on your chest.  You have a fever. GET HELP RIGHT AWAY IF:   You have more pain or pain that spreads to your arm, neck, jaw, back, or belly (abdomen).  You have shortness of breath.  You cough more than usual or cough up blood.  You have very bad back or belly pain.  You feel sick to your stomach (nauseous) or throw up (vomit).  You have very bad weakness.  You pass out (faint).  You have chills. This is an emergency. Do not wait to see if the problems will go away. Call your local emergency services (911 in [place] Do not drive yourself to the hospital. MAKE SURE YOU:   Understand these instructions.  Will watch your condition.  Will get help right away  if you are not doing well or get worse. Document Released: 09/28/2007 Document Revised: 04/16/2013 Document Reviewed: 09/28/2007 [facility] Patient Information 2015 [place], [place]. This information is not intended to replace advice given to you by your health care provider. Make sure you discuss any questions you have with your health care provider.

## 2014-12-22 NOTE — ED Provider Notes (Addendum)
CSN: [id number]     Arrival date & time 12/22/14  0036 History  This chart was scribed for Gregory Balls, MD by Evelene Croon, ED Scribe. This patient was seen in room WA16/WA16 and the patient's care was started 1:08 AM.      Chief Complaint  Patient presents with  . Chest Pain   The history is provided by the patient. No language interpreter was used.     HPI Comments:  DEMARIUS Baker is a 39 y.o. male who presents to the Emergency Department complaining of central non-radiating cramping CP started while driving last night. He denies history of similar pain.  He reports associated  Palpitations, states he feels like his heart is racing, mild diaphoresis, and mild SOB. He denies nausea and vomiting . Pt reports recent travel states he was on a 1 and half hour flight and then and 8 day cruise. He denies recent surgeries. No alleviating factors noted.  History reviewed. No pertinent past medical history. Past Surgical History  Procedure Laterality Date  . Right foot  2007  . Femur fracture surgery Right 2011  . Hemorrhoid surgery N/A 08/04/2014    Procedure: HEMORRHOIDECTOMY;  Surgeon: Aviva Signs Md, MD;  Location: [facility];  Service: General;  Laterality: N/A;   No family history on file. Social History  Substance Use Topics  . Smoking status: Never Smoker   . Smokeless tobacco: None  . Alcohol Use: No    Review of Systems  A complete 10 system review of systems was obtained and all systems are negative except as noted in the HPI and PMH.    Allergies  Review of patient's allergies indicates no known allergies.  Home Medications   Prior to Admission medications   Medication Sig Start Date End Date Taking? Authorizing Provider  acetaminophen (TYLENOL) 500 MG tablet Take 1,000 mg by mouth every 6 (six) hours as needed for mild pain. Pain    Historical Provider, MD  FLAXSEED, LINSEED, PO Take 1 capsule by mouth daily as needed (patient takes when he wants to.).     Historical  Provider, MD  ibuprofen (ADVIL,MOTRIN) 200 MG tablet Take 800 mg by mouth every 6 (six) hours as needed for mild pain. Pain    Historical Provider, MD  oxyCODONE-acetaminophen (PERCOCET) 7.5-325 MG per tablet Take 1-2 tablets by mouth every 4 (four) hours as needed. 08/04/14   Aviva Signs, MD   BP 180/90 mmHg  Pulse 115  Temp(Src) 97.7 F (36.5 C) (Oral)  Resp 20  Wt 262 lb (118.842 kg)  SpO2 100% Physical Exam  Constitutional: He is oriented to person, place, and time. Vital signs are normal. He appears well-developed and well-nourished.  Non-toxic appearance. He does not appear ill. No distress.  HENT:  Head: Normocephalic and atraumatic.  Nose: Nose normal.  Mouth/Throat: Oropharynx is clear and moist. No oropharyngeal exudate.  Eyes: Conjunctivae and EOM are normal. Pupils are equal, round, and reactive to light. No scleral icterus.  Neck: Normal range of motion. Neck supple. No tracheal deviation, no edema, no erythema and normal range of motion present. No thyroid mass and no thyromegaly present.  Cardiovascular: Normal rate, regular rhythm, S1 normal, S2 normal, normal heart sounds, intact distal pulses and normal pulses.  Exam reveals no gallop and no friction rub.   No murmur heard. Pulses:      Radial pulses are 2+ on the right side, and 2+ on the left side.       Dorsalis pedis  pulses are 2+ on the right side, and 2+ on the left side.  Pulmonary/Chest: Effort normal and breath sounds normal. No respiratory distress. He has no wheezes. He has no rhonchi. He has no rales.  Abdominal: Soft. Normal appearance and bowel sounds are normal. He exhibits no distension, no ascites and no mass. There is no hepatosplenomegaly. There is no tenderness. There is no rebound, no guarding and no CVA tenderness.  Musculoskeletal: Normal range of motion. He exhibits no edema or tenderness.  Lymphadenopathy:    He has no cervical adenopathy.  Neurological: He is alert and oriented to person,  place, and time. He has normal strength. No cranial nerve deficit or sensory deficit.  Skin: Skin is warm, dry and intact. No petechiae and no rash noted. He is not diaphoretic. No erythema. No pallor.  Psychiatric: He has a normal mood and affect. His behavior is normal. Judgment normal.  Nursing note and vitals reviewed.   ED Course  Procedures   DIAGNOSTIC STUDIES:  Oxygen Saturation is 98% on RA, normal by my interpretation.    COORDINATION OF CARE:  1:12 AM Discussed treatment plan with pt at bedside and pt agreed to plan.  Labs Review Labs Reviewed  BASIC METABOLIC PANEL - Abnormal; Notable for the following:    Sodium 134 (*)    Glucose, Bld 147 (*)    Creatinine, Ser 1.34 (*)    Calcium 8.6 (*)    Anion gap 4 (*)    All other components within normal limits  I-STAT CHEM 8, ED - Abnormal; Notable for the following:    Chloride 100 (*)    Creatinine, Ser 1.30 (*)    Glucose, Bld 146 (*)    Calcium, Ion 1.08 (*)    All other components within normal limits  CBC  URINE RAPID DRUG SCREEN, HOSP PERFORMED  I-STAT TROPOININ, ED    Imaging Review Dg Chest 2 View  12/22/2014   CLINICAL DATA:  Chest pain and mild dyspnea, 1 hour duration.  EXAM: CHEST  2 VIEW  COMPARISON:  02/02/2010  FINDINGS: The heart size and mediastinal contours are within normal limits. Both lungs are clear. The visualized skeletal structures are unremarkable.  IMPRESSION: No active cardiopulmonary disease.   Electronically Signed   By: Andreas Newport M.D.   On: 12/22/2014 01:18   I have personally reviewed and evaluated these images and lab results as part of my medical decision-making.   EKG Interpretation   Date/Time:  Monday December 22 2014 00:49:25 EDT Ventricular Rate:  95 PR Interval:  190 QRS Duration: 128 QT Interval:  396 QTC Calculation: 498 R Axis:   60 Text Interpretation:  Sinus rhythm ST depression in V2-V5 No old tracing  to compare Confirmed by Glynn Octave [phone])  on 12/22/2014  12:54:11 AM      MDM   Final diagnoses:  None    patient presents to the emergency department for chest pain. His history is not consistent with ACS however his EKG does have changes of ST depressions in the anterior leads.   Will obtain ED cardiac workup , patient was given aspirin and nitroglycerin as he is still currently having chest pain.  He denies any past history medical problems outside of borderline hypertension , however his blood pressure is 180/90.  He takes no medications.   initial troponin is negative. Patient be admitted to telemetry. I spoke with [facility] hospitalist, Dr.Karkakandy  Who accepts the patient. He recommends to give the patient  morphine to help his chest pain.  I personally performed the services described in this documentation, which was scribed in my presence. The recorded information has been reviewed and is accurate.    Gregory Balls, MD 12/22/14 0203  Patient has now decided he does not want to stay in the hospital for evaluation.  Dr. Jabier Mutton informed me of this.  Patient is aware of EKG changes and risk of cardiac death.  He was encouraged to seek care immediately and currently has decision making capacity.  He is still requesting to leave despite his sister in the room agreeing he needs to stay.  He will be DC AMA from the ED.  Gregory Balls, MD 12/22/14 [phone]

## 2014-12-22 NOTE — ED Notes (Signed)
Pt states he does NOT want any narcotics at all during this admission.  Pt will not elaborate on why he will not accept narcotics.

## 2014-12-22 NOTE — ED Notes (Addendum)
Pt c/o left chest tightness a one hour and shortness of breath all began while he was driving.

## 2014-12-25 ENCOUNTER — Encounter: Payer: Self-pay | Admitting: Cardiovascular Disease

## 2014-12-25 ENCOUNTER — Ambulatory Visit (INDEPENDENT_AMBULATORY_CARE_PROVIDER_SITE_OTHER): Payer: 59 | Admitting: Cardiovascular Disease

## 2014-12-25 VITALS — BP 142/94 | HR 66 | Ht 75.5 in | Wt 277.8 lb

## 2014-12-25 DIAGNOSIS — R030 Elevated blood-pressure reading, without diagnosis of hypertension: Secondary | ICD-10-CM | POA: Diagnosis not present

## 2014-12-25 DIAGNOSIS — R072 Precordial pain: Secondary | ICD-10-CM | POA: Diagnosis not present

## 2014-12-25 DIAGNOSIS — Z713 Dietary counseling and surveillance: Secondary | ICD-10-CM

## 2014-12-25 DIAGNOSIS — R9431 Abnormal electrocardiogram [ECG] [EKG]: Secondary | ICD-10-CM

## 2014-12-25 DIAGNOSIS — IMO0001 Reserved for inherently not codable concepts without codable children: Secondary | ICD-10-CM

## 2014-12-25 NOTE — Progress Notes (Signed)
Patient ID: Gregory Baker, male   DOB: [birth year], 39 y.o.   MRN: [id number]       CARDIOLOGY CONSULT NOTE  Patient ID: Gregory Baker MRN: [id number] DOB/AGE: 1976/03/01 39 y.o.  Admit date: (Not on file) Primary Physician Purvis Kilts, MD  Reason for Consultation: chest pain, hypertension  HPI: The patient is a 40 yr old male with no cardiovascular history who presents for the evaluation of chest pain. He was evaluated in the ED on 12/22/14. Chest x-ray was normal. Blood pressure was 180/90, heart rate 115 bpm, with normal oxygen saturations. There were 0.5 mm horizontal ST segment depressions in V2-5.  Initial point of care troponin was normal. It was recommended he be hospitalized but he left the ED against medical advice.  The pain was retrosternal in location and without radiation, occurring while he was driving home from [place] after spending the weekend there and feeling fatigued. He has no prior history of similar pain. He had associated palpitations with mild diaphoresis and shortness of breath accompanying this. He denied nausea and vomiting.  He exercises fairly regularly and is able to run 2 miles in 13:30. He also does Insanity workouts employing plyometrics and denies any exertional chest discomfort or shortness of breath. He also denies dizziness, lightheadedness, leg swelling, orthopnea, abdominal pain, and paroxysmal nocturnal dyspnea.  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  Approximately 3 months ago, he checked his BP and it was 141/93 at his wife, Dr. Oneida Alar' office.  By the time he left the ED, SBP was 133. He has been checking his BP since then. On Tuesday, right arm 141/79, left arm 127/73.  On Wednesday 134/78.  Had an anxiety attack at age 41 but none since.  Says he likes fried chicken and pizza but cooks quite a bit at home and avoids drinking soft  drinks and roughly 4 L of water daily.  Does not smoke.  Fam: Both parents were heavy smokers and died of lung cancer. Neither had HTN or CAD.  Soc: Married to Dr. Barney Drain. 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   No Known Allergies  Current Outpatient Prescriptions  Medication Sig Dispense Refill  . acetaminophen (TYLENOL) 500 MG tablet Take 1,000 mg by mouth every 6 (six) hours as needed for mild pain. Pain    . ALPRAZolam (XANAX) 0.5 MG tablet Take 0.5 mg by mouth at bedtime as needed for anxiety.    Marland Kitchen FLAXSEED, LINSEED, PO Take 1 capsule by mouth daily as needed (patient takes when he wants to.).     Marland Kitchen ibuprofen (ADVIL,MOTRIN) 200 MG tablet Take 800 mg by mouth every 6 (six) hours as needed for mild pain. Pain     No current facility-administered medications for this visit.    Past Medical History  Diagnosis Date  . Medical history non-contributory     Past Surgical History  Procedure Laterality Date  . Right foot  2007  . Femur fracture surgery Right 2011  . Hemorrhoid surgery N/A 08/04/2014    Procedure: HEMORRHOIDECTOMY;  Surgeon: Aviva Signs Md, MD;  Location: [facility];  Service: General;  Laterality: N/A;    Social History   Social History  . Marital Status: Married    Spouse Name: N/A  .  Number of Children: N/A  . Years of Education: N/A   Occupational History  . Not on file.   Social History Main Topics  . Smoking status: Never Smoker   . Smokeless tobacco: Not on file  . Alcohol Use: No  . Drug Use: No  . Sexual Activity: Yes   Other Topics Concern  . Not on file   Social History Narrative     No family history of premature CAD in 1st degree relatives.  Prior to Admission medications   Medication Sig Start Date End Date Taking? Authorizing Provider  acetaminophen (TYLENOL) 500 MG tablet Take 1,000 mg by mouth every 6 (six) hours as needed for  mild pain. Pain    Historical Provider, MD  FLAXSEED, LINSEED, PO Take 1 capsule by mouth daily as needed (patient takes when he wants to.).     Historical Provider, MD  ibuprofen (ADVIL,MOTRIN) 200 MG tablet Take 800 mg by mouth every 6 (six) hours as needed for mild pain. Pain    Historical Provider, MD  oxyCODONE-acetaminophen (PERCOCET) 7.5-325 MG per tablet Take 1-2 tablets by mouth every 4 (four) hours as needed. 08/04/14   Aviva Signs, MD     Review of systems complete and found to be negative unless listed above in HPI     Physical exam Blood pressure 142/94, pulse 66, height 6' 3.5" (1.918 m), weight 277 lb 12.8 oz (126.009 kg), SpO2 95 %. General: NAD Neck: No JVD, no thyromegaly or thyroid nodule.  Lungs: Clear to auscultation bilaterally with normal respiratory effort. CV: Nondisplaced PMI. Regular rate and rhythm, normal S1/S2, no S3/S4, no murmur.  No peripheral edema.  No carotid bruit.  Normal pedal pulses.  Abdomen: Soft, nontender, no hepatosplenomegaly, no distention.  Skin: Intact without lesions or rashes.  Neurologic: Alert and oriented x 3.  Psych: Normal affect. Extremities: No clubbing or cyanosis.  HEENT: Normal.   ECG: Most recent ECG reviewed.  Labs:   Lab Results  Component Value Date   WBC 6.6 12/22/2014   HGB 15.0 12/22/2014   HCT 44.0 12/22/2014   MCV 92.1 12/22/2014   PLT 199 12/22/2014     Recent Labs Lab 12/22/14 0106 12/22/14 0122  NA 134* 136  K 3.6 3.5  CL 104 100*  CO2 26  --   BUN 18 19  CREATININE 1.34* 1.30*  CALCIUM 8.6*  --   GLUCOSE 147* 146*   No results found for: CKTOTAL, CKMB, CKMBINDEX, TROPONINI No results found for: CHOL No results found for: HDL No results found for: LDLCALC No results found for: TRIG No results found for: CHOLHDL No results found for: LDLDIRECT       Studies: No results found.  ASSESSMENT AND PLAN:  1. Chest pain: Uncertain if pain was due to markedly elevated BP. Given his exercise  tolerance and lack of exertional symptoms, ischemic heart disease is less likely. Will obtain an echocardiogram to evaluate cardiac structure and function.  2. Essential HTN: Again elevated today but he is nervous. I have asked the patient to check blood pressure readings 4 times per week, at different times throughout the day, in order to get a better approximation of mean BP values. These results will be provided to me at the end of that period so that I can determine if antihypertensive medication titration is indicated.  If persistently elevated, would consider starting amlodipine 5 mg daily. Will obtain echocardiogram to also assess for left ventricular hypertrophy and left ventricular end-diastolic filling  pressures to determine chronicity of elevated blood pressure.  3. Dietary counseling given, specifically with regards to low-sodium diet.  Dispo: f/u 8-10 weeks.   Signed: Kate Sable, M.D., F.A.C.C.  12/25/2014, 3:39 PM

## 2014-12-25 NOTE — Patient Instructions (Addendum)
Your physician recommends that you schedule a follow-up appointment in: 8-10 weeks with Dr Bronson Ing   Keep BP log and return it with next visit   Your physician recommends that you continue on your current medications as directed. Please refer to the Current Medication list given to you today.     Your physician has requested that you have an echocardiogram. Echocardiography is a painless test that uses sound waves to create images of your heart. It provides your doctor with information about the size and shape of your heart and how well your heart's chambers and valves are working. This procedure takes approximately one hour. There are no restrictions for this procedure.  Thank you for choosing [facility] !

## 2014-12-30 ENCOUNTER — Ambulatory Visit (HOSPITAL_COMMUNITY)
Admission: RE | Admit: 2014-12-30 | Discharge: 2014-12-30 | Disposition: A | Discharge status: Home / Self Care | Payer: 59 | Source: Ambulatory Visit | Attending: Cardiology | Admitting: Cardiology

## 2014-12-30 DIAGNOSIS — R030 Elevated blood-pressure reading, without diagnosis of hypertension: Secondary | ICD-10-CM | POA: Diagnosis not present

## 2014-12-30 DIAGNOSIS — R079 Chest pain, unspecified: Secondary | ICD-10-CM | POA: Insufficient documentation

## 2014-12-30 DIAGNOSIS — R9431 Abnormal electrocardiogram [ECG] [EKG]: Secondary | ICD-10-CM | POA: Insufficient documentation

## 2014-12-30 DIAGNOSIS — IMO0001 Reserved for inherently not codable concepts without codable children: Secondary | ICD-10-CM

## 2014-12-30 DIAGNOSIS — R072 Precordial pain: Secondary | ICD-10-CM

## 2014-12-30 NOTE — Progress Notes (Signed)
*  PRELIMINARY RESULTS* Echocardiogram 2D Echocardiogram has been performed.  Leavy Cella 12/30/2014, 2:36 PM

## 2014-12-31 ENCOUNTER — Telehealth: Payer: Self-pay | Admitting: Cardiovascular Disease

## 2014-12-31 DIAGNOSIS — R030 Elevated blood-pressure reading, without diagnosis of hypertension: Principal | ICD-10-CM

## 2014-12-31 DIAGNOSIS — IMO0001 Reserved for inherently not codable concepts without codable children: Secondary | ICD-10-CM

## 2014-12-31 NOTE — Telephone Encounter (Signed)
Will forward if ok to refer

## 2014-12-31 NOTE — Telephone Encounter (Signed)
That would be fine 

## 2014-12-31 NOTE — Telephone Encounter (Signed)
Patient is requesting a referral to a nutrionist

## 2015-01-01 NOTE — Telephone Encounter (Signed)
Pt was very concerned over echo results, further explained and will place orders for nutritionist and mail dash diet to better control BP. Pt appreciative and will forward to schedulers for referral

## 2015-01-02 ENCOUNTER — Telehealth: Payer: Self-pay | Admitting: Cardiovascular Disease

## 2015-01-02 ENCOUNTER — Encounter: Payer: 59 | Admitting: Cardiology

## 2015-01-02 NOTE — Telephone Encounter (Signed)
Gregory Baker has been scheduled for appointment with Nutritionist on October 6th. He is very concerned about not being scheduled Sooner.  Called [phone] and left message for Nutritionist to review schedules and to contact patient if she can get an Earlier appointment. Patient also would like to go to the [facility].

## 2015-01-05 NOTE — Telephone Encounter (Signed)
Noted  

## 2015-01-29 ENCOUNTER — Ambulatory Visit: Payer: 59 | Admitting: Nutrition

## 2015-02-20 ENCOUNTER — Encounter: Payer: Self-pay | Admitting: Cardiovascular Disease

## 2015-02-20 ENCOUNTER — Ambulatory Visit (INDEPENDENT_AMBULATORY_CARE_PROVIDER_SITE_OTHER): Payer: 59 | Admitting: Cardiovascular Disease

## 2015-02-20 VITALS — BP 136/80 | HR 70 | Ht 75.0 in | Wt 271.0 lb

## 2015-02-20 DIAGNOSIS — Z713 Dietary counseling and surveillance: Secondary | ICD-10-CM | POA: Diagnosis not present

## 2015-02-20 DIAGNOSIS — I517 Cardiomegaly: Secondary | ICD-10-CM | POA: Diagnosis not present

## 2015-02-20 DIAGNOSIS — R030 Elevated blood-pressure reading, without diagnosis of hypertension: Secondary | ICD-10-CM | POA: Diagnosis not present

## 2015-02-20 DIAGNOSIS — IMO0001 Reserved for inherently not codable concepts without codable children: Secondary | ICD-10-CM

## 2015-02-20 NOTE — Progress Notes (Signed)
Patient ID: Gregory Baker, male   DOB: 03/09/1976, 39 y.o.   MRN: [id number]      SUBJECTIVE: The patient returns for follow-up after undergoing cardiovascular testing performed for the evaluation of chest pain and elevated BP.  Echocardiogram on 12/30/14 demonstrated normal left ventricular systolic function, EF 01-74%, normal diastolic function, mild to moderate concentric left ventricular hypertrophy, and mild left atrial enlargement.  He is doing well and denies chest pain, leg swelling, and shortness of breath. He has meticulously been monitoring his blood pressures and they have been in the normal to prehypertensive range. He has cut back on salt and eating out and has lost 6 pounds. He continues to exercise regularly. He has several questions regarding his echocardiogram as well as primary prevention of heart disease.  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   Review of Systems: As per "subjective", otherwise negative.  No Known Allergies  Current Outpatient Prescriptions  Medication Sig Dispense Refill  . acetaminophen (TYLENOL) 500 MG tablet Take 1,000 mg by mouth every 6 (six) hours as needed for mild pain. Pain    . ALPRAZolam (XANAX) 0.5 MG tablet Take 0.5 mg by mouth at bedtime as needed for anxiety.    Marland Kitchen FLAXSEED, LINSEED, PO Take 1 capsule by mouth daily as needed (patient takes when he wants to.).     Marland Kitchen ibuprofen (ADVIL,MOTRIN) 200 MG tablet Take 800 mg by mouth every 6 (six) hours as needed for mild pain. Pain     No current facility-administered medications for this visit.    Past Medical History  Diagnosis Date  . Medical history non-contributory     Past Surgical History  Procedure Laterality Date  . Right foot  2007  . Femur fracture surgery Right 2011  . Hemorrhoid surgery N/A 08/04/2014    Procedure: HEMORRHOIDECTOMY;  Surgeon: Aviva Signs Md, MD;  Location: [facility];  Service: General;  Laterality: N/A;    Social History    Social History  . Marital Status: Married    Spouse Name: N/A  . Number of Children: N/A  . Years of Education: N/A   Occupational History  . Not on file.   Social History Main Topics  . Smoking status: Never Smoker   . Smokeless tobacco: Not on file  . Alcohol Use: No  . Drug Use: No  . Sexual Activity: Yes   Other Topics Concern  . Not on file   Social History Narrative     Filed Vitals:   02/20/15 0932  BP: 136/80  Pulse: 70  Height: 6\' 3"  (1.905 m)  Weight: 271 lb (122.925 kg)  SpO2: 98%    PHYSICAL EXAM General: NAD HEENT: Normal. Neck: No JVD, no thyromegaly. Lungs: Clear to auscultation bilaterally with normal respiratory effort. CV: Nondisplaced PMI.  Regular rate and rhythm, normal S1/S2, no S3/S4, no murmur. No pretibial or periankle edema.    Abdomen: Soft, no distention.  Neurologic: Alert and oriented x 3.  Psych: Normal affect. Skin: Normal. Musculoskeletal: Normal range of motion, no gross deformities. Extremities: No clubbing or cyanosis.   ECG: Most recent ECG reviewed.      ASSESSMENT AND PLAN: 1. Chest pain: Symptomatically stable. Uncertain if prior episode of pain was due to markedly elevated BP. Given his exercise tolerance and lack of exertional symptoms, ischemic heart disease is less likely. Echocardiogram showed normal systolic and diastolic function. Features consistent with an athletic heart.  2. Elevated BP/pre-hypertension: A review of his blood  pressure log showed several normal readings with some readings in the prehypertensive range. We talked about primary prevention with continued dietary modification, and reduced sodium intake in particular. He will continue with his exercise regimen. I've asked him to check his blood pressure twice per week and to trend them. No medical therapy is warranted at this time, but I did tell him that he is at risk for the development of hypertension.  3. Dietary counseling again given,  specifically with regards to low-sodium diet.  Dispo: f/u 1 year.    Kate Sable, M.D., F.A.C.C.

## 2015-02-20 NOTE — Patient Instructions (Signed)
Your physician wants you to follow-up in:  1 year with Dr Koneswaran You will receive a reminder letter in the mail two months in advance. If you don't receive a letter, please call our office to schedule the follow-up appointment.    Your physician recommends that you continue on your current medications as directed. Please refer to the Current Medication list given to you today.    If you need a refill on your cardiac medications before your next appointment, please call your pharmacy.     Thank you for choosing [facility] !        

## 2015-05-14 ENCOUNTER — Telehealth: Payer: Self-pay | Admitting: Nutrition

## 2015-05-14 NOTE — Telephone Encounter (Signed)
Vm to call and reschedule canceled appt. PC

## 2015-09-09 ENCOUNTER — Telehealth: Payer: Self-pay | Admitting: Nutrition

## 2015-09-09 NOTE — Telephone Encounter (Signed)
VM to call for appt.

## 2015-09-12 IMAGING — CR DG CHEST 2V
2 series · 2 of 2 positions shown · non-contrast
Comparison: 02/02/2010

CLINICAL DATA: Chest pain and mild dyspnea, 1 hour duration.

EXAM:
CHEST  2 VIEW

[w chest pa]
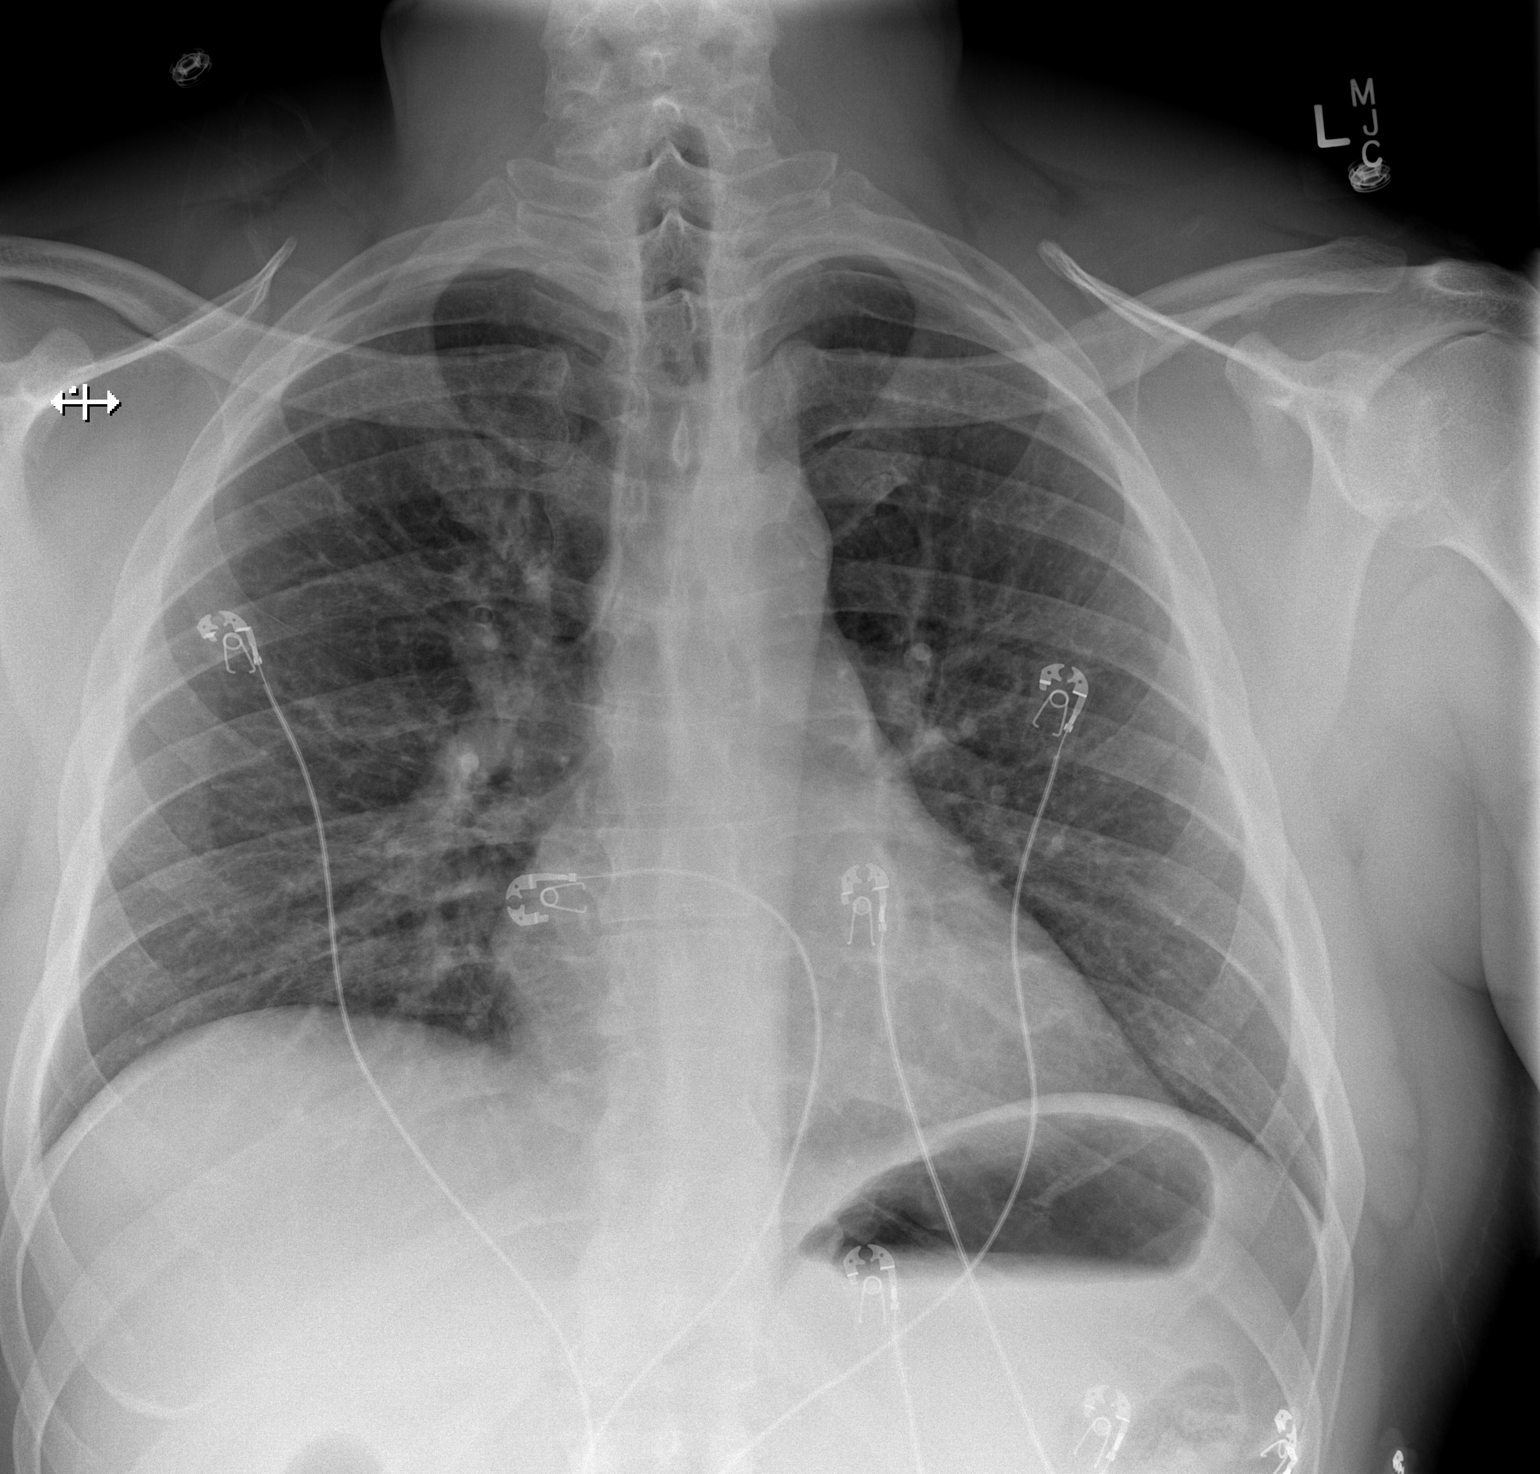

[w chest lat]
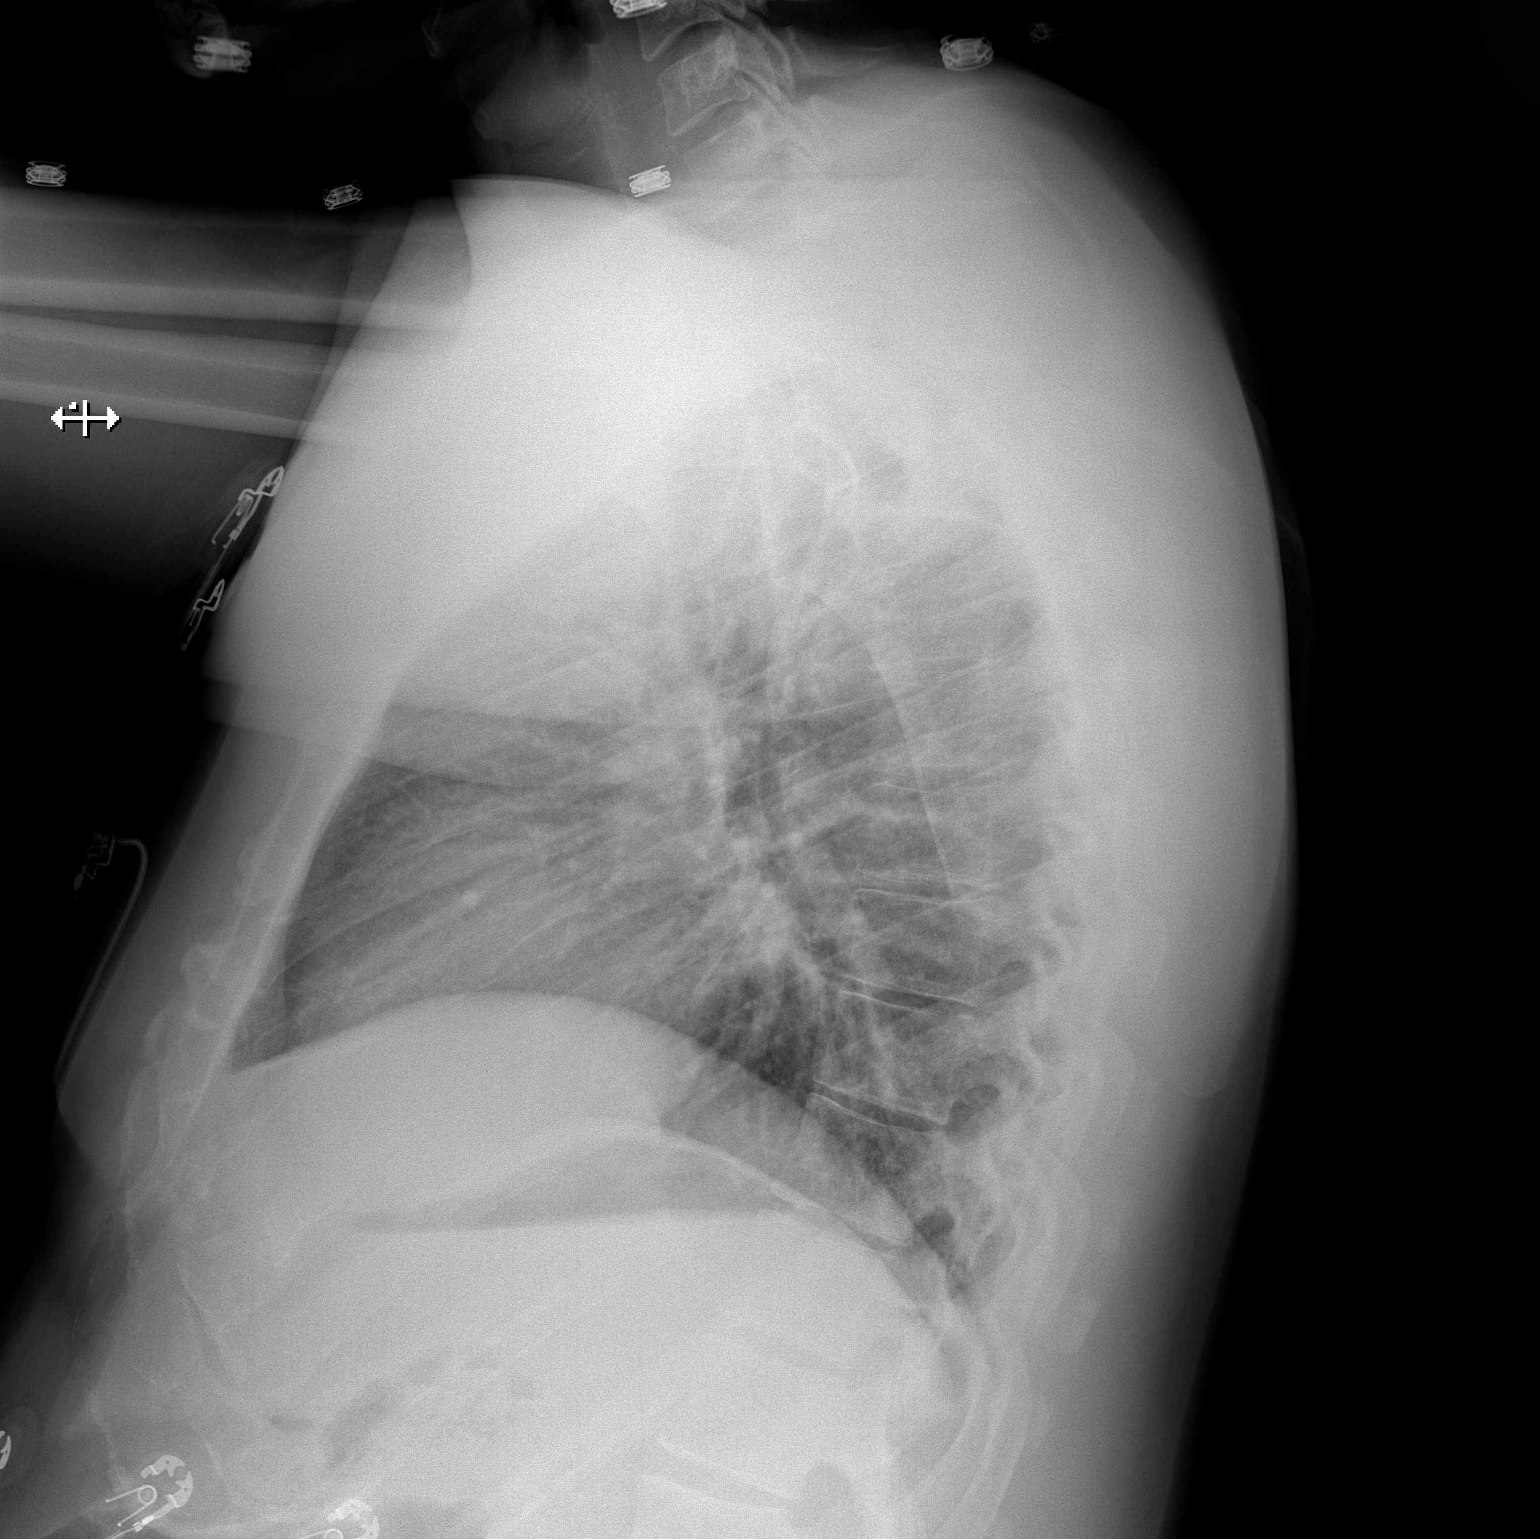

[2 of 2 positions shown; findings below may reference images not displayed]

FINDINGS: The heart size and mediastinal contours are within normal limits.
Both lungs are clear. The visualized skeletal structures are
unremarkable.
IMPRESSION: No active cardiopulmonary disease.

## 2016-05-23 DIAGNOSIS — K645 Perianal venous thrombosis: Secondary | ICD-10-CM | POA: Diagnosis not present

## 2016-05-25 ENCOUNTER — Encounter: Payer: Self-pay | Admitting: Family Medicine

## 2016-05-25 ENCOUNTER — Ambulatory Visit (INDEPENDENT_AMBULATORY_CARE_PROVIDER_SITE_OTHER): Payer: 59 | Admitting: Family Medicine

## 2016-05-25 VITALS — BP 142/78 | HR 62 | Temp 97.9°F | Ht 75.0 in | Wt 251.6 lb

## 2016-05-25 DIAGNOSIS — R030 Elevated blood-pressure reading, without diagnosis of hypertension: Secondary | ICD-10-CM | POA: Diagnosis not present

## 2016-05-25 DIAGNOSIS — R7301 Impaired fasting glucose: Secondary | ICD-10-CM | POA: Diagnosis not present

## 2016-05-25 DIAGNOSIS — R634 Abnormal weight loss: Secondary | ICD-10-CM | POA: Diagnosis not present

## 2016-05-25 DIAGNOSIS — Z0000 Encounter for general adult medical examination without abnormal findings: Secondary | ICD-10-CM | POA: Diagnosis not present

## 2016-05-25 LAB — COMPREHENSIVE METABOLIC PANEL
ALT: 21 U/L (ref 0–53)
AST: 21 U/L (ref 0–37)
Albumin: 4.6 g/dL (ref 3.5–5.2)
Alkaline Phosphatase: 55 U/L (ref 39–117)
BUN: 15 mg/dL (ref 6–23)
CO2: 30 mEq/L (ref 19–32)
Calcium: 9.5 mg/dL (ref 8.4–10.5)
Chloride: 105 mEq/L (ref 96–112)
Creatinine, Ser: 1.2 mg/dL (ref 0.40–1.50)
GFR: 86.21 mL/min (ref >60.00)
Glucose, Bld: 104 mg/dL — ABNORMAL HIGH (ref 70–99)
Potassium: 4 mEq/L (ref 3.5–5.1)
Sodium: 139 mEq/L (ref 135–145)
Total Bilirubin: 0.6 mg/dL (ref 0.2–1.2)
Total Protein: 7.5 g/dL (ref 6.0–8.3)

## 2016-05-25 LAB — CBC WITH DIFFERENTIAL/PLATELET
Basophils Absolute: 0 10*3/uL (ref 0.0–0.1)
Basophils Relative: 0.5 % (ref 0.0–3.0)
Eosinophils Absolute: 0 10*3/uL (ref 0.0–0.7)
Eosinophils Relative: 0.8 % (ref 0.0–5.0)
HCT: 45.1 % (ref 39.0–52.0)
Hemoglobin: 14.9 g/dL (ref 13.0–17.0)
Lymphocytes Relative: 36.2 % (ref 12.0–46.0)
Lymphs Abs: 1.8 10*3/uL (ref 0.7–4.0)
MCHC: 33.1 g/dL (ref 30.0–36.0)
MCV: 92 fl (ref 78.0–100.0)
Monocytes Absolute: 0.4 10*3/uL (ref 0.1–1.0)
Monocytes Relative: 7.4 % (ref 3.0–12.0)
Neutro Abs: 2.7 10*3/uL (ref 1.4–7.7)
Neutrophils Relative %: 55.1 % (ref 43.0–77.0)
Platelets: 215 10*3/uL (ref 150.0–400.0)
RBC: 4.91 Mil/uL (ref 4.22–5.81)
RDW: 12.7 % (ref 11.5–15.5)
WBC: 4.9 10*3/uL (ref 4.0–10.5)

## 2016-05-25 LAB — TSH: TSH: 1.77 u[IU]/mL (ref 0.35–4.50)

## 2016-05-25 LAB — LIPID PANEL
Cholesterol: 147 mg/dL (ref 0–200)
HDL: 42.9 mg/dL (ref >39.00)
LDL Cholesterol: 91 mg/dL (ref 0–99)
NonHDL: 104.56
Total CHOL/HDL Ratio: 3
Triglycerides: 70 mg/dL (ref 0.0–149.0)
VLDL: 14 mg/dL (ref 0.0–40.0)

## 2016-05-25 NOTE — Progress Notes (Signed)
Pre visit review using our clinic review tool, if applicable. No additional management support is needed unless otherwise documented below in the visit note. 

## 2016-05-25 NOTE — Patient Instructions (Signed)
It was so nice to meet you today!  I should have your lab work by the end of the week.

## 2016-05-25 NOTE — Progress Notes (Signed)
Subjective:    Gregory Baker is a 41 y.o. male and is here for a comprehensive physical exam.   Chief Complaint  Patient presents with  . Establish Care    NP   . Annual Exam    Diet: Eats 1-2 meals per day. Usually later in the evening and night, due to stressful schedule.    Exercise: Regular. Recently did a boot camp.   Weight in (lb) to have BMI = 25: 199.6 Notes 20 pound weight loss over 2 years, but also admits to diet change, eating less.   Past Medical History:  Diagnosis Date  . Elevated BP without diagnosis of hypertension     Past Surgical History:  Procedure Laterality Date  . FEMUR FRACTURE SURGERY Right 2011  . HEMORRHOID SURGERY N/A 08/04/2014   Procedure: HEMORRHOIDECTOMY;  Surgeon: Aviva Signs Md, MD;  Location: [facility];  Service: General;  Laterality: N/A;  . MINOR HEMORRHOIDECTOMY    . 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 Smoking status: Never Smoker  . Smokeless tobacco: Never Used  . Alcohol use 0.0 oz/week     Comment: Social  . Drug use: No  . Sexual activity: Yes    Partners: Female    Family History  Problem Relation Age of Onset  . Lung cancer Mother   . CAD Neg Hx   . Hypertension Neg Hx    No Known Allergies    Outpatient Medications Prior to Visit  Medication Sig Dispense Refill  . acetaminophen (TYLENOL) 500 MG tablet Take 1,000 mg by mouth every 6 (six) hours as needed  for mild pain. Pain    . ALPRAZolam (XANAX) 0.5 MG tablet Take 0.5 mg by mouth at bedtime as needed for anxiety.    Marland Kitchen FLAXSEED, LINSEED, PO Take 1 capsule by mouth daily as needed (patient takes when he wants to.).     Marland Kitchen ibuprofen (ADVIL,MOTRIN) 200 MG tablet Take 800 mg by mouth every 6 (six) hours as needed for mild pain. Pain     No facility-administered medications prior to visit.      Review of Systems  Constitutional: Positive for weight loss.  All other systems reviewed and are negative.    Objective:   Vitals:   05/25/16 1007  BP: (!) 142/78  Pulse: 62  Temp: 97.9 F (36.6 C)   Body mass index is 31.45 kg/m.  General Appearance:  Alert, cooperative, no distress, appears stated age  Head:  Normocephalic, without obvious abnormality, atraumatic  Eyes:  PERRL, conjunctiva/corneas clear, EOM's intact, fundi benign, both eyes       Ears:  Normal TM's and external ear canals, both ears  Nose: Nares normal, septum midline, mucosa normal, no drainage    or sinus tenderness  Throat: Lips, mucosa, and tongue normal; teeth and gums normal  Neck: Supple, symmetrical, trachea midline, no adenopathy;  thyroid:  No enlargement/tenderness/nodules; no carotit bruit or JVD  Back:   Symmetric, no curvature, ROM normal, no CVA tenderness  Lungs:   Clear to auscultation bilaterally, respirations unlabored  Chest wall:  No tenderness or deformity  Heart:  Regular rate and rhythm, S1 and S2 normal, no murmur, rub   or gallop  Abdomen:   Soft, non-tender, bowel sounds active all four quadrants, no masses, no organomegaly  Extremities: Extremities normal, atraumatic, no cyanosis or edema  Prostate: Not done  Skin: Skin color, texture, turgor normal, no rashes or lesions  Lymph nodes: Cervical, supraclavicular, and axillary nodes normal  Neurologic: CNII-XII grossly intact. Normal strength, sensation and reflexes throughout    Results for orders placed or performed in visit on 05/25/16    CBC with Differential/Platelet  Result Value Ref Range   WBC 4.9 4.0 - 10.5 K/uL   RBC 4.91 4.22 - 5.81 Mil/uL   Hemoglobin 14.9 13.0 - 17.0 g/dL   HCT 45.1 39.0 - 52.0 %   MCV 92.0 78.0 - 100.0 fl   MCHC 33.1 30.0 - 36.0 g/dL   RDW 12.7 11.5 - 15.5 %   Platelets 215.0 150.0 - 400.0 K/uL   Neutrophils Relative % 55.1 43.0 - 77.0 %   Lymphocytes Relative 36.2 12.0 - 46.0 %   Monocytes Relative 7.4 3.0 - 12.0 %   Eosinophils Relative 0.8 0.0 - 5.0 %   Basophils Relative 0.5 0.0 - 3.0 %   Neutro Abs 2.7 1.4 - 7.7 K/uL   Lymphs Abs 1.8 0.7 - 4.0 K/uL   Monocytes Absolute 0.4 0.1 - 1.0 K/uL   Eosinophils Absolute 0.0 0.0 - 0.7 K/uL   Basophils Absolute 0.0 0.0 - 0.1 K/uL  Comprehensive metabolic panel  Result Value Ref Range   Sodium 139 135 - 145 mEq/L   Potassium 4.0 3.5 - 5.1 mEq/L   Chloride 105 96 - 112 mEq/L   CO2 30 19 - 32 mEq/L   Glucose, Bld 104 (H) 70 - 99 mg/dL   BUN 15 6 - 23 mg/dL   Creatinine, Ser 1.20 0.40 - 1.50 mg/dL   Total Bilirubin 0.6 0.2 - 1.2 mg/dL   Alkaline Phosphatase 55 39 - 117 U/L   AST 21 0 - 37 U/L   ALT 21 0 - 53 U/L   Total Protein 7.5 6.0 - 8.3 g/dL   Albumin 4.6 3.5 - 5.2 g/dL   Calcium 9.5 8.4 - 10.5 mg/dL   GFR 86.21 >60.00 mL/min  TSH  Result Value Ref Range   TSH 1.77 0.35 - 4.50 uIU/mL  Lipid panel  Result Value Ref Range   Cholesterol 147 0 - 200 mg/dL   Triglycerides 70.0 0.0 - 149.0 mg/dL   HDL 42.90 >39.00 mg/dL   VLDL 14.0 0.0 - 40.0 mg/dL   LDL Cholesterol 91 0 - 99 mg/dL   Total CHOL/HDL Ratio 3    NonHDL 104.56      Assessment/Plan:    Gregory Baker was seen today for establish care and annual exam.  Diagnoses and all orders for this visit:  Weight loss -     CBC with Differential/Platelet -     Comprehensive metabolic panel -     TSH -     Lipid panel  Elevated blood pressure reading in office without diagnosis of hypertension Comments: Pateint will check BP at home. He will follow up if it remains elevated  (>130/80).  Fasting hyperglycemia -     Hemoglobin A1c  Physical exam, annual  Well Adult Exam: Labs ordered: Yes. Patient counseling was done. See below for items discussed. Discussed the patient's BMI.  The BMI BMI is in the acceptable range Follow up next physical in 1 year.   Patient Counseling: [x]   Nutrition: Stressed importance of moderation in sodium/caffeine intake, saturated fat and cholesterol, caloric balance, sufficient intake of fresh fruits, vegetables, and fiber.  [x]   Stressed the importance of regular exercise.   []   Substance Abuse: Discussed cessation/primary prevention of tobacco, alcohol, or other drug use; driving or other dangerous activities under the influence; availability of treatment for abuse.   []   Sexuality: Discussed sexually transmitted diseases, partner selection, use of condoms, avoidance of unintended pregnancy  and contraceptive alternatives. Injury prevention: Discussed safety belts, safety helmets, smoke detector, smoking near bedding or upholstery.   []   Dental health: Discussed importance of regular tooth brushing, flossing, and dental visits.  [x]   Health maintenance and immunizations reviewed. Please refer to Health maintenance section.

## 2016-05-26 ENCOUNTER — Telehealth: Payer: Self-pay | Admitting: Surgical

## 2016-05-26 ENCOUNTER — Other Ambulatory Visit (INDEPENDENT_AMBULATORY_CARE_PROVIDER_SITE_OTHER): Payer: 59

## 2016-05-26 ENCOUNTER — Encounter: Payer: Self-pay | Admitting: Family Medicine

## 2016-05-26 DIAGNOSIS — R7301 Impaired fasting glucose: Secondary | ICD-10-CM | POA: Diagnosis not present

## 2016-05-26 LAB — HEMOGLOBIN A1C: Hgb A1c MFr Bld: 5.8 % (ref 4.6–6.5)

## 2016-05-26 NOTE — Addendum Note (Signed)
Addended by: Frutoso Chase A on: 05/26/2016 12:21 PM   Modules accepted: Orders

## 2016-05-26 NOTE — Telephone Encounter (Signed)
Patient called back about A1C being added to his test. He stated that he feels like this was unnecessary testing. He stated that he would talk with doctor at next visit.

## 2016-06-08 ENCOUNTER — Encounter: Payer: Self-pay | Admitting: Gastroenterology

## 2016-07-13 ENCOUNTER — Ambulatory Visit: Payer: 59 | Admitting: Gastroenterology

## 2017-01-25 ENCOUNTER — Ambulatory Visit (INDEPENDENT_AMBULATORY_CARE_PROVIDER_SITE_OTHER): Payer: 59

## 2017-01-25 DIAGNOSIS — Z23 Encounter for immunization: Secondary | ICD-10-CM

## 2017-05-25 ENCOUNTER — Ambulatory Visit (INDEPENDENT_AMBULATORY_CARE_PROVIDER_SITE_OTHER): Payer: 59 | Admitting: Family Medicine

## 2017-05-25 ENCOUNTER — Ambulatory Visit: Payer: 59 | Admitting: Family Medicine

## 2017-05-25 ENCOUNTER — Encounter: Payer: Self-pay | Admitting: Family Medicine

## 2017-05-25 DIAGNOSIS — F419 Anxiety disorder, unspecified: Secondary | ICD-10-CM | POA: Insufficient documentation

## 2017-05-25 MED ORDER — ALPRAZOLAM 0.5 MG PO TABS: 0.5000 mg | ORAL_TABLET | Freq: Two times a day (BID) | ORAL | 0 refills | Status: DC | PRN

## 2017-05-25 NOTE — Progress Notes (Signed)
    Subjective:  Gregory Baker is a 42 y.o. male who presents today for same-day appointment with a chief complaint of anxeity.   HPI:  Anxiety, New Problem Patient diagnosed with generalized anxiety disorder several years ago as a teenager.  Since then, he has intermittently been on medications as needed.  Reports that he has now been on any medication for the past 4-5 years.  He is previously given a prescription for Xanax, which per his report lasted him for about 3-4 years.  Symptoms have significantly worsened over the last few weeks to months.  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  He has also developed some somatic symptoms including tingling in his fingers and some discoordination.  He will be seeing a therapist later this week.  As noted above, symptoms seem to be worse with increased life stress.  Symptoms have improved in the past with increased exercise.  No SI or HI.  GAD 7 : Generalized Anxiety Score 05/25/2017  Nervous, Anxious, on Edge 2  Control/stop worrying 3  Worry too much - different things 3  Trouble relaxing 3  Restless 3  Easily annoyed or irritable 3  Afraid - awful might happen 2  Total GAD 7 Score 19  Anxiety Difficulty Somewhat difficult   ROS: Per HPI  PMH: He reports that  has never smoked. he has never used smokeless tobacco. He reports that he drinks alcohol. He reports that he does not use drugs.  Objective:  Physical Exam: BP 128/84 (BP Location: Left Arm, Patient Position: Sitting, Cuff Size: Normal)   Pulse (!) 58   Temp 97.7 F (36.5 C) (Oral)   Ht 6\' 3"  (1.905 m)   Wt 260 lb 3.2 oz (118 kg)   SpO2 98%   BMI 32.52 kg/m   Gen: NAD, resting comfortably Skin: Warm, dry Neuro: Grossly normal, moves all extremities Psych: Normal affect and thought content  Assessment/Plan:  Anxiety GAD elevated to 19 today.  Discussed treatment options with  patient including psychotherapy, and pharmacotherapy with either a daily medication and/or medication to use as needed.  Patient will be following up with therapist later this week.  At this point, given that he has done well Xanax in the past, patient wished to restart this medication.  We had a discussion for alternative options including SSRI and hydroxyzine.  Discussed the risks associated with chronic benzodiazepine use.  Patient acknowledged these risks.  His database was reviewed without red flags.  A prescription for Xanax was sent into his pharmacy.  Patient was instructed to follow-up with his primary care physician for ongoing management of his anxiety, with strict instruction to return if symptoms worsen or are not responding to his medication and psychotherapy.  Algis Greenhouse. Jerline Pain, MD 05/25/2017 11:31 AM

## 2017-05-25 NOTE — Patient Instructions (Signed)
I will refill your xanax.  If you start noticing that oyu need this medication daily or nearly everyday, it may be time to start a daily "controller" medication.  Please let Dr [name] know if your symptoms worsen or are not improving over the next few weeks.  Take care, Dr Jerline Pain

## 2017-05-30 ENCOUNTER — Telehealth: Payer: Self-pay | Admitting: Family Medicine

## 2017-05-30 NOTE — Telephone Encounter (Signed)
[place] with me. Please place any necessary orders.  Will route to patient's PCP. He should follow up with her soon.

## 2017-05-30 NOTE — Telephone Encounter (Signed)
Relation to pt: self  Call back number: [phone]       Reason for call:   Patient was seen by Dr. Jerline Pain 05/25/17 due to "tingling in his fingers and some discoordination" patient requesting referral placed with Delano Metz MD neurologist, [address], [place], [place] [address] [phone], please advise

## 2017-05-30 NOTE — Telephone Encounter (Signed)
Please advise 

## 2017-06-05 DIAGNOSIS — R531 Weakness: Secondary | ICD-10-CM | POA: Diagnosis not present

## 2017-06-05 DIAGNOSIS — F419 Anxiety disorder, unspecified: Secondary | ICD-10-CM | POA: Diagnosis not present

## 2017-06-23 DIAGNOSIS — R208 Other disturbances of skin sensation: Secondary | ICD-10-CM | POA: Diagnosis not present

## 2017-10-17 ENCOUNTER — Encounter: Payer: Self-pay | Admitting: Family Medicine

## 2017-10-17 NOTE — Progress Notes (Signed)
Subjective:    Gregory Baker is a 42 y.o. male and is here for a comprehensive physical exam.  Health Maintenance Due  Topic Date Due  . HIV Screening  05/01/1991  . TETANUS/TDAP  05/01/1995     Current Outpatient Medications:  .  ALPRAZolam (XANAX) 0.5 MG tablet, Take 1 tablet (0.5 mg total) by mouth 2 (two) times daily as needed for anxiety., Disp: 30 tablet, Rfl: 0  PMHx, SurgHx, SocialHx, Medications, and Allergies were reviewed in the Visit Navigator and updated as appropriate.   Past Medical History:  Diagnosis Date  . Anxiety    Past Surgical History:  Procedure Laterality Date  . FEMUR FRACTURE SURGERY Right 2011  . FOOT FRACTURE SURGERY Right   . HEMORRHOID SURGERY N/A 08/04/2014   Procedure: HEMORRHOIDECTOMY;  Surgeon: Aviva Signs Md, MD;  Location: [facility];  Service: General;  Laterality: N/A;  . MINOR HEMORRHOIDECTOMY     Family History  Problem Relation Age of Onset  . Lung cancer Mother   . CAD Neg Hx   . Hypertension Neg Hx    Social History   Tobacco Use  . Smoking status: Never Smoker  . Smokeless tobacco: Never Used  Substance Use Topics  . Alcohol use: Yes    Alcohol/week: 0.0 oz    Comment: Social  . Drug use: No    Review of Systems:   Pertinent items are noted in the HPI. Otherwise, ROS is negative.  Objective:   BP 124/82   Pulse 61   Temp 98.6 F (37 C) (Oral)   Ht 6\' 3"  (1.905 m)   Wt 263 lb (119.3 kg)   SpO2 98%   BMI 32.87 kg/m   General appearance: alert, cooperative and appears stated age. Head: normocephalic, without obvious abnormality, atraumatic. Neck: no adenopathy, supple, symmetrical, trachea midline; thyroid not enlarged, symmetric, no tenderness/mass/nodules. Lungs: clear to auscultation bilaterally. Heart: regular rate and rhythm Abdomen: soft, non-tender; no masses,  no organomegaly. Extremities: extremities normal, atraumatic, no cyanosis or edema. Skin: skin color, texture, turgor normal, no rashes or  lesions. Lymph: cervical, supraclavicular, and axillary nodes normal; no abnormal inguinal nodes palpated. Neurologic: grossly normal.                                      Assessment/Plan:   Gregory Baker was seen today for annual exam.  Diagnoses and all orders for this visit:  Routine physical examination  Screening for lipid disorders -     Comprehensive metabolic panel -     Lipid panel  Encounter for screening for HIV -     HIV antibody  High risk medication use  Other orders -     ALPRAZolam (XANAX) 0.5 MG tablet; Take 1 tablet (0.5 mg total) by mouth 2 (two) times daily as needed for anxiety. -     Tdap vaccine greater than or equal to 7yo IM    Patient Counseling: [x]    Nutrition: Stressed importance of moderation in sodium/caffeine intake, saturated fat and cholesterol, caloric balance, sufficient intake of fresh fruits, vegetables, fiber, calcium, iron, and 1 mg of folate supplement per day (for females capable of pregnancy).  [x]    Stressed the importance of regular exercise.   [x]    Substance Abuse: Discussed cessation/primary prevention of tobacco, alcohol, or other drug use; driving or other dangerous activities under the influence; availability of treatment for abuse.   [  x]   Injury prevention: Discussed safety belts, safety helmets, smoke detector, smoking near bedding or upholstery.   [x]    Sexuality: Discussed sexually transmitted diseases, partner selection, use of condoms, avoidance of unintended pregnancy  and contraceptive alternatives.  [x]    Dental health: Discussed importance of regular tooth brushing, flossing, and dental visits.  [x]    Health maintenance and immunizations reviewed. Please refer to Health maintenance section.   Gregory Deutscher, DO [place]

## 2017-10-18 ENCOUNTER — Encounter: Payer: Self-pay | Admitting: Family Medicine

## 2017-10-18 ENCOUNTER — Ambulatory Visit (INDEPENDENT_AMBULATORY_CARE_PROVIDER_SITE_OTHER): Payer: 59 | Admitting: Family Medicine

## 2017-10-18 VITALS — BP 124/82 | HR 61 | Temp 98.6°F | Ht 75.0 in | Wt 263.0 lb

## 2017-10-18 DIAGNOSIS — Z114 Encounter for screening for human immunodeficiency virus [HIV]: Secondary | ICD-10-CM | POA: Diagnosis not present

## 2017-10-18 DIAGNOSIS — Z13220 Encounter for screening for lipoid disorders: Secondary | ICD-10-CM

## 2017-10-18 DIAGNOSIS — Z79899 Other long term (current) drug therapy: Secondary | ICD-10-CM | POA: Diagnosis not present

## 2017-10-18 DIAGNOSIS — Z0000 Encounter for general adult medical examination without abnormal findings: Secondary | ICD-10-CM | POA: Diagnosis not present

## 2017-10-18 DIAGNOSIS — Z23 Encounter for immunization: Secondary | ICD-10-CM | POA: Diagnosis not present

## 2017-10-18 LAB — COMPREHENSIVE METABOLIC PANEL
ALT: 19 U/L (ref 0–53)
AST: 23 U/L (ref 0–37)
Albumin: 4.4 g/dL (ref 3.5–5.2)
Alkaline Phosphatase: 45 U/L (ref 39–117)
BUN: 10 mg/dL (ref 6–23)
CO2: 29 mEq/L (ref 19–32)
Calcium: 9.4 mg/dL (ref 8.4–10.5)
Chloride: 105 mEq/L (ref 96–112)
Creatinine, Ser: 1.17 mg/dL (ref 0.40–1.50)
GFR: 88.15 mL/min (ref >60.00)
Glucose, Bld: 113 mg/dL — ABNORMAL HIGH (ref 70–99)
Potassium: 3.9 mEq/L (ref 3.5–5.1)
Sodium: 139 mEq/L (ref 135–145)
Total Bilirubin: 0.5 mg/dL (ref 0.2–1.2)
Total Protein: 7.3 g/dL (ref 6.0–8.3)

## 2017-10-18 LAB — LIPID PANEL
Cholesterol: 162 mg/dL (ref 0–200)
HDL: 44.6 mg/dL (ref >39.00)
LDL Cholesterol: 101 mg/dL — ABNORMAL HIGH (ref 0–99)
NonHDL: 117.51
Total CHOL/HDL Ratio: 4
Triglycerides: 82 mg/dL (ref 0.0–149.0)
VLDL: 16.4 mg/dL (ref 0.0–40.0)

## 2017-10-18 MED ORDER — ALPRAZOLAM 0.5 MG PO TABS: 0.5000 mg | ORAL_TABLET | Freq: Two times a day (BID) | ORAL | 0 refills | Status: DC | PRN

## 2017-10-19 LAB — HIV ANTIBODY (ROUTINE TESTING W REFLEX): HIV 1&2 Ab, 4th Generation: NONREACTIVE

## 2017-11-28 DIAGNOSIS — M79672 Pain in left foot: Secondary | ICD-10-CM | POA: Diagnosis not present

## 2017-11-28 DIAGNOSIS — D3613 Benign neoplasm of peripheral nerves and autonomic nervous system of lower limb, including hip: Secondary | ICD-10-CM | POA: Diagnosis not present

## 2018-04-26 DIAGNOSIS — M9903 Segmental and somatic dysfunction of lumbar region: Secondary | ICD-10-CM | POA: Diagnosis not present

## 2018-04-26 DIAGNOSIS — M546 Pain in thoracic spine: Secondary | ICD-10-CM | POA: Diagnosis not present

## 2018-04-26 DIAGNOSIS — M9901 Segmental and somatic dysfunction of cervical region: Secondary | ICD-10-CM | POA: Diagnosis not present

## 2018-04-26 DIAGNOSIS — M542 Cervicalgia: Secondary | ICD-10-CM | POA: Diagnosis not present

## 2018-04-26 DIAGNOSIS — M545 Low back pain: Secondary | ICD-10-CM | POA: Diagnosis not present

## 2018-04-26 DIAGNOSIS — M9902 Segmental and somatic dysfunction of thoracic region: Secondary | ICD-10-CM | POA: Diagnosis not present

## 2018-06-20 ENCOUNTER — Encounter: Payer: 59 | Admitting: Family Medicine

## 2018-07-23 ENCOUNTER — Other Ambulatory Visit: Payer: Self-pay | Admitting: Family Medicine

## 2018-07-24 MED ORDER — ALPRAZOLAM 0.5 MG PO TABS: 0.5000 mg | ORAL_TABLET | Freq: Two times a day (BID) | ORAL | 0 refills | Status: DC | PRN

## 2018-10-23 ENCOUNTER — Other Ambulatory Visit: Payer: Self-pay

## 2018-10-23 ENCOUNTER — Encounter: Payer: Self-pay | Admitting: Family Medicine

## 2018-10-23 ENCOUNTER — Telehealth: Payer: Self-pay | Admitting: Family Medicine

## 2018-10-23 ENCOUNTER — Ambulatory Visit (INDEPENDENT_AMBULATORY_CARE_PROVIDER_SITE_OTHER): Payer: 59 | Admitting: Family Medicine

## 2018-10-23 VITALS — BP 138/81 | HR 61 | Temp 98.6°F | Ht 75.0 in | Wt 256.2 lb

## 2018-10-23 DIAGNOSIS — Z0000 Encounter for general adult medical examination without abnormal findings: Secondary | ICD-10-CM | POA: Diagnosis not present

## 2018-10-23 DIAGNOSIS — E7800 Pure hypercholesterolemia, unspecified: Secondary | ICD-10-CM | POA: Diagnosis not present

## 2018-10-23 LAB — COMPREHENSIVE METABOLIC PANEL
ALT: 18 U/L (ref 0–53)
AST: 21 U/L (ref 0–37)
Albumin: 4.5 g/dL (ref 3.5–5.2)
Alkaline Phosphatase: 51 U/L (ref 39–117)
BUN: 13 mg/dL (ref 6–23)
CO2: 26 mEq/L (ref 19–32)
Calcium: 8.9 mg/dL (ref 8.4–10.5)
Chloride: 103 mEq/L (ref 96–112)
Creatinine, Ser: 1.2 mg/dL (ref 0.40–1.50)
GFR: 80.15 mL/min (ref >60.00)
Glucose, Bld: 93 mg/dL (ref 70–99)
Potassium: 4.2 mEq/L (ref 3.5–5.1)
Sodium: 137 mEq/L (ref 135–145)
Total Bilirubin: 0.6 mg/dL (ref 0.2–1.2)
Total Protein: 6.9 g/dL (ref 6.0–8.3)

## 2018-10-23 LAB — LIPID PANEL
Cholesterol: 155 mg/dL (ref 0–200)
HDL: 41.3 mg/dL (ref >39.00)
LDL Cholesterol: 100 mg/dL — ABNORMAL HIGH (ref 0–99)
NonHDL: 114.11
Total CHOL/HDL Ratio: 4
Triglycerides: 69 mg/dL (ref 0.0–149.0)
VLDL: 13.8 mg/dL (ref 0.0–40.0)

## 2018-10-23 NOTE — Progress Notes (Addendum)
Subjective:    COLVIN BLATT is a 43 y.o. male who presents today for his Complete Annual Exam.    Current Outpatient Medications:  .  ALPRAZolam (XANAX) 0.5 MG tablet, Take 1 tablet (0.5 mg total) by mouth 2 (two) times daily as needed for anxiety., Disp: 30 tablet, Rfl: 0  There are no preventive care reminders to display for this patient.  PMHx, SurgHx, SocialHx, Medications, and Allergies were reviewed in the Visit Navigator and updated as appropriate.   Past Medical History:  Diagnosis Date  . Anxiety      Past Surgical History:  Procedure Laterality Date  . FEMUR FRACTURE SURGERY Right 2011  . FOOT FRACTURE SURGERY Right   . HEMORRHOID SURGERY N/A 08/04/2014   Procedure: HEMORRHOIDECTOMY;  Surgeon: Aviva Signs Md, MD;  Location: [facility];  Service: General;  Laterality: N/A;  . MINOR HEMORRHOIDECTOMY       Family History  Problem Relation Age of Onset  . Lung cancer Mother   . CAD Neg Hx   . Hypertension Neg Hx    Social History   Tobacco Use  . Smoking status: Never Smoker  . Smokeless tobacco: Never Used  Substance Use Topics  . Alcohol use: Yes    Alcohol/week: 0.0 standard drinks    Comment: Social  . Drug use: No    Review of Systems:   Pertinent items are noted in the HPI. Otherwise, ROS is negative.  Objective:   Vitals:   10/23/18 0821  BP: 138/81  Pulse: 61  Temp: 98.6 F (37 C)  SpO2: 99%   Body mass index is 32.02 kg/m.  General Appearance:  Alert, cooperative, no distress, appears stated age  Head:  Normocephalic, without obvious abnormality, atraumatic  Eyes:  PERRL, conjunctiva/corneas clear, EOM's intact, fundi benign, both eyes       Ears:  Normal TM's and external ear canals, both ears  Nose: Nares normal, septum midline, mucosa normal, no drainage    or sinus tenderness  Throat: Lips, mucosa, and tongue normal; teeth and gums normal  Neck: Supple, symmetrical, trachea midline, no adenopathy; thyroid:  No  enlargement/tenderness/nodules; no carotit bruit or JVD  Back:   Symmetric, no curvature, ROM normal, no CVA tenderness  Lungs:   Clear to auscultation bilaterally, respirations unlabored  Chest wall:  No tenderness or deformity  Heart:  Regular rate and rhythm, S1 and S2 normal, no murmur, rub   or gallop  Abdomen:   Soft, non-tender, bowel sounds active all four quadrants, no masses, no organomegaly  Extremities: Extremities normal, atraumatic, no cyanosis or edema  Prostate: Not done.   Skin: Skin color, texture, turgor normal, no rashes or lesions  Lymph nodes: Cervical, supraclavicular, and axillary nodes normal  Neurologic: CNII-XII grossly intact. Normal strength, sensation and reflexes throughout   Assessment/Plan:   Miciah was seen today for annual exam.  Diagnoses and all orders for this visit:  Routine physical examination  Pure hypercholesterolemia -     Comprehensive metabolic panel -     Lipid panel   Patient Counseling: [x]   Nutrition: Stressed importance of moderation in sodium/caffeine intake, saturated fat and cholesterol, caloric balance, sufficient intake of fresh fruits, vegetables, and fiber.  [x]   Stressed the importance of regular exercise.   []   Substance Abuse: Discussed cessation/primary prevention of tobacco, alcohol, or other drug use; driving or other dangerous activities under the influence; availability of treatment for abuse.   [x]   Injury prevention: Discussed safety belts, safety  helmets, smoke detector, smoking near bedding or upholstery.   []   Sexuality: Discussed sexually transmitted diseases, partner selection, use of condoms, avoidance of unintended pregnancy and contraceptive alternatives.   [x]   Dental health: Discussed importance of regular tooth brushing, flossing, and dental visits.  [x]   Health maintenance and immunizations reviewed. Please refer to Health maintenance section.    Briscoe Deutscher, DO [place]

## 2018-10-23 NOTE — Telephone Encounter (Signed)
In addition, I did ask the patient if he would like another clinical personnel to check his blood pressure and he declined. He stated he just wanted to express his concerns that he now has a blood pressure log and will use it as he agrees his reading were rather high however, does not want the reading reflected on the after visit summary or on his chart.

## 2018-10-23 NOTE — Telephone Encounter (Signed)
Patient came into the office requesting to speak with management. I brought the patient into my office and he began to explain his concerns. The patient stated that he had a visit today and had his blood pressure checked by JoEllen who stated that she injured her ear and had to use the dynamap to read his blood pressure. The patient stated that he saw the reading on the dynamap read 138/81 however, it is reflected on his after visit summary that it was 138/91. The patient stated that JoEllen stated the reading was correct with 138/91 and that someone else could check it.   The patient stated that Brendell came into the room and stated that she would be checking his blood pressure manually. The reading she stated was 140/91. The patient said that he mentioned that normally he has to wait 5 min before another reading, he stated Brendell said she would check after Dr. [name] saw him for the visit.   The patient stated that he saw Dr. [name] and they spoke about his health and also the blood pressure readings. He said that he mentioned to her that before having his blood pressure checked again he would need 5 minutes to ensure he can get a good reading. He stated that Dr. [name] stated she understood and would send someone in to check his blood pressure. The patient stated that Brendell came into the room right after (before 5 min) and said that she was going to check his blood pressure again. The patient stated that he just mentioned to Dr. [name] that he has to wait 5 min and Brendell responded "I will give you 2 min." The patient stated that he felt that was a hostile response yet allowed her to check the reading. The reading was 138/92 yet the after visit summary states 138/91.   The patient is concerned that he now has a blood pressure log and will use it as he agrees his reading were rather high however, does not want the reading reflected on the after visit summary or on his chart. He wants the reading  he saw on the dynamap 138/81 on his chart and after visit summary. He stated that he felt he was not heard and did not appreciate the hostility about getting his blood pressure checked and the incorrect information in his chart. He was concerned about incorrect readings and stated there have been many people who have died due to incorrect readings.  I empathized with the patient and apologized for how he felt that there was hostility during his appointment and I would communicate his concerns with Brittney to contact him to follow up on the matter due to this being a clinical incident that needs to be looked into. The patient appreciated me listening and hearing his concerns. I have notified Brittney.

## 2018-10-23 NOTE — Telephone Encounter (Signed)
Called patient to follow up with his concerns no answer left VM for patient to call back.

## 2018-10-24 ENCOUNTER — Encounter: Payer: Self-pay | Admitting: Family Medicine

## 2018-10-24 NOTE — Telephone Encounter (Signed)
Added from the last message. In regard to the messages below there are several inaccuracies that both myself and Brendell have with the following messages that we would like to make sure have been documented in response. Want to make sure that it has been addressed in order to made sure the patient and staff are understood and respected. I want to make sure that we are able to continue to grow the proper relationship with Gregory Baker while making sure that he feels his concerns are addresses in the proper way. We do want him to feel confidante with ever area of this office.

## 2018-10-24 NOTE — Telephone Encounter (Signed)
Spoke with patient in regards to His concerns everything listed below is what he was concerned about. I reassured the patient we would like for his experience here in the office to be a great experience, and I would look into all his concerns. I did ask the patient if he would like to come in to have his b/p reading done again and he declined.

## 2018-10-26 NOTE — Telephone Encounter (Signed)
This, and all other messages from patient re: last visit, routed to RN lead and Engineer, building services.

## 2019-02-05 ENCOUNTER — Telehealth: Payer: Self-pay

## 2019-02-05 NOTE — Telephone Encounter (Signed)
Patient called in wanting to know if Dr. [name] will accept him as the a new patient. Current patient of Dr. [name]. Father of Gregory Baker. [phone]

## 2019-02-20 ENCOUNTER — Other Ambulatory Visit (INDEPENDENT_AMBULATORY_CARE_PROVIDER_SITE_OTHER): Payer: Self-pay | Admitting: *Deleted

## 2019-02-20 DIAGNOSIS — Z20822 Contact with and (suspected) exposure to covid-19: Secondary | ICD-10-CM

## 2019-02-20 DIAGNOSIS — Z20828 Contact with and (suspected) exposure to other viral communicable diseases: Secondary | ICD-10-CM

## 2019-02-22 ENCOUNTER — Other Ambulatory Visit: Payer: Self-pay

## 2019-02-22 DIAGNOSIS — Z20822 Contact with and (suspected) exposure to covid-19: Secondary | ICD-10-CM

## 2019-02-24 LAB — NOVEL CORONAVIRUS, NAA: SARS-CoV-2, NAA: NOT DETECTED

## 2019-05-08 DIAGNOSIS — M25552 Pain in left hip: Secondary | ICD-10-CM | POA: Diagnosis not present

## 2019-05-08 DIAGNOSIS — M545 Low back pain: Secondary | ICD-10-CM | POA: Diagnosis not present

## 2019-05-08 DIAGNOSIS — M9905 Segmental and somatic dysfunction of pelvic region: Secondary | ICD-10-CM | POA: Diagnosis not present

## 2019-05-08 DIAGNOSIS — M542 Cervicalgia: Secondary | ICD-10-CM | POA: Diagnosis not present

## 2019-05-08 DIAGNOSIS — M25512 Pain in left shoulder: Secondary | ICD-10-CM | POA: Diagnosis not present

## 2019-05-08 DIAGNOSIS — M9902 Segmental and somatic dysfunction of thoracic region: Secondary | ICD-10-CM | POA: Diagnosis not present

## 2019-05-08 DIAGNOSIS — M62830 Muscle spasm of back: Secondary | ICD-10-CM | POA: Diagnosis not present

## 2019-06-16 DIAGNOSIS — Z20828 Contact with and (suspected) exposure to other viral communicable diseases: Secondary | ICD-10-CM | POA: Diagnosis not present

## 2019-07-30 ENCOUNTER — Telehealth: Payer: Self-pay | Admitting: General Practice

## 2019-07-30 NOTE — Telephone Encounter (Signed)
Patient called in asking if Elyn Aquas would be willing to take him on as a new patient, his old PCP was Briscoe Deutscher, states that his daughter Jc, Gindhart (04/20/2010) is a current patient of his.

## 2019-07-30 NOTE — Telephone Encounter (Signed)
Patient scheduled to establish care and NPPW mailed out.

## 2019-07-30 NOTE — Telephone Encounter (Signed)
Please advise 

## 2019-07-30 NOTE — Telephone Encounter (Signed)
Hey Can you call patient to schedule a transfer of care

## 2019-07-30 NOTE — Telephone Encounter (Signed)
Of course. We would be happy to have him.

## 2019-08-02 ENCOUNTER — Ambulatory Visit: Payer: 59

## 2019-08-02 ENCOUNTER — Other Ambulatory Visit: Payer: Self-pay

## 2019-08-02 ENCOUNTER — Ambulatory Visit (INDEPENDENT_AMBULATORY_CARE_PROVIDER_SITE_OTHER): Payer: 59 | Admitting: Orthopedic Surgery

## 2019-08-02 ENCOUNTER — Other Ambulatory Visit: Payer: Self-pay | Admitting: Orthopedic Surgery

## 2019-08-02 VITALS — Temp 98.6°F | Ht 76.0 in | Wt 248.0 lb

## 2019-08-02 DIAGNOSIS — M25551 Pain in right hip: Secondary | ICD-10-CM | POA: Diagnosis not present

## 2019-08-02 DIAGNOSIS — M545 Low back pain, unspecified: Secondary | ICD-10-CM

## 2019-08-02 NOTE — Progress Notes (Signed)
Chief Complaint  Patient presents with  . Back Pain    Back pain.    Gregory Baker had fracture of his right femur treated elsewhere with Recon nail did well and saw me as a follow-up patient in 2012 had no problems until recently when he was doing a yoga position with his right leg and felt a pull that radiated from his groin to his rectal area and since that time he is been having some difficulties with pain in the proximal portion of his right hip and groin and then he also complains of what he calls piriformis pain in the left side of his buttocks.  The right hip injury started when he was doing side planks and now he feels that he can do that maneuver again but when he circumduct his hip he feels a pinching sensation in the proximal lateral crease of the leg and a popping sensation.  The left side of the buttock and back area seems to have pain in the lower back and gluteal region he says that stretching does help but when he changed mattresses he did not notice any change in symptoms  Review of systems no real groin pain no pain climbing the steps no plain after sitting for long periods of time with the hip although he has some pain when sitting for long time with his left lower back and gluteal region and indicates that he has been sitting a lot during the pandemic while working at home  He was able to ride a bike 10 miles with his daughter without any symptoms does not have any symptoms with driving in the car right or left side  Denies numbness or tingling down the legs and there is no bowel or bladder dysfunction noted  Past Medical History:  Diagnosis Date  . Anxiety     Temp 98.6 F (37 C)   Ht 6\' 4"  (1.93 m)   Wt 248 lb (112.5 kg)   BMI 30.19 kg/m   Gregory Baker is well-developed well-nourished grooming and hygiene are intact he looks to be in good condition health with mesomorphic body habitus pleasant mood normal affect normal gait  In the supine position his left hip flexion was normal  internal/external rotation abduction abduction was normal Stinchfield test was normal.  Leg lengths were equal neurovascular exam was normal there is no muscle atrophy  In the supine position on the right hip had excellent flexion abduction internal rotation external rotation is flexion abduction internal rotation test did not reproduce any groin pain  Straight leg raises were negative neurovascular exam was intact  We then examined him prone and with external rotation he felt some pain on the left side in the lower back but no tenderness to palpation in the actual piriformis fossa  Lumbar spine tenderness in the left lower back musculature.  Imaging  Femur shows a femoral nail without complication no AVN excellent fracture alignment no hardware complication  Lumbar spine shows coronal plane L2 apex scoliosis appears to be reactive there is a spur at L3 anterior margin to spaces are maintained our eyes are foramen and sagittal plane alignment was normal  Encounter Diagnoses  Name Primary?  . Pain in right hip Yes  . Acute left-sided low back pain without sciatica     Differential diagnosis includes snapping hip syndrome, labral tear right hip, back pain without sciatica strain lumbar spine versus piriformis syndrome  I told him he can do any yoga position that does not bother his  hip there is an outside chance he has a labral tear but very unlikely his lumbar pain explain some of his truncal asymmetry on x-ray and should correct as long as this is not a structural curve once his pain gets under control  He is allowed exercise bike and do pretty much what he wants as long as it does not cause any discomfort  If for some reason his hip pain became more symptomatic or his flexion internal rotation abduction test became more symptomatic an MRI with contrast could be done to see if there was a labral tear  He will follow up on an as-needed basis

## 2019-08-02 NOTE — Patient Instructions (Signed)
Continue yoga avoid exercises and yoga that bother your back continue exercise  Your back should straighten out when the pain in the lower back goes away otherwise her fracture and femoral fixation look great

## 2019-08-05 ENCOUNTER — Ambulatory Visit (INDEPENDENT_AMBULATORY_CARE_PROVIDER_SITE_OTHER): Payer: 59 | Admitting: Physician Assistant

## 2019-08-05 ENCOUNTER — Other Ambulatory Visit: Payer: Self-pay

## 2019-08-05 ENCOUNTER — Encounter: Payer: Self-pay | Admitting: Physician Assistant

## 2019-08-05 VITALS — BP 118/82 | HR 74 | Temp 98.0°F | Resp 16 | Ht 75.0 in | Wt 249.0 lb

## 2019-08-05 DIAGNOSIS — F418 Other specified anxiety disorders: Secondary | ICD-10-CM

## 2019-08-05 MED ORDER — ALPRAZOLAM 0.5 MG PO TABS: 0.5000 mg | ORAL_TABLET | Freq: Two times a day (BID) | ORAL | 0 refills | Status: AC | PRN

## 2019-08-05 NOTE — Progress Notes (Signed)
Patient presents to clinic today to transfer care from our [facility] location. Records already in EMR for review.  Due for CPE in June (Last 10/23/2018)  Patient with history of situational anxiety, on regimen of alprazolam 0.5 mg as needed for episodes of significant acute anxiety.  Patient states he has not taken a pill in several weeks.  Last prescription for 30 tablets filled March 2020.  Denies any depressed mood or generalized anxiety.  Is sleeping well overall.  Stays hydrated and tries to keep a well-balanced diet.  Past Medical History:  Diagnosis Date  . Anxiety     Current Outpatient Medications on File Prior to Visit  Medication Sig Dispense Refill  . ALPRAZolam (XANAX) 0.5 MG tablet Take 1 tablet (0.5 mg total) by mouth 2 (two) times daily as needed for anxiety. 30 tablet 0   No current facility-administered medications on file prior to visit.    No Known Allergies  Family History  Problem Relation Age of Onset  . Lung cancer Mother   . CAD Neg Hx   . Hypertension Neg Hx     Social History   Socioeconomic History  . Marital status: Married    Spouse name: Not on file  . Number of children: Not on file  . Years of education: Not on file  . Highest education level: Not on file  Occupational History  . Occupation: Scientist, clinical (histocompatibility and immunogenetics)  Tobacco Use  . Smoking status: Never Smoker  . Smokeless tobacco: Never Used  Substance and Sexual Activity  . Alcohol use: Yes    Alcohol/week: 0.0 standard drinks    Comment: Social  . Drug use: No  . Sexual activity: Yes    Partners: Female  Other Topics Concern  . 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 Difficulty of Paying Living Expenses:   Food Insecurity:   . Worried About Charity fundraiser in the Last Year:   . Arboriculturist in the Last Year:   Transportation Needs:   . Film/video editor (Medical):   Marland Kitchen Lack of Transportation (Non-Medical):   Physical Activity:   . Days of Exercise per Week:   . Minutes of Exercise per Session:   Stress:   . Feeling of Stress :   Social Connections:   . Frequency of Communication with Friends and Family:   . Frequency of Social Gatherings with Friends and Family:   . Attends Religious Services:   . Active Member of Clubs or Organizations:   . Attends Archivist Meetings:   Marland Kitchen Marital Status:    Review of Systems - See HPI.  All other ROS are negative.  Temp 98 F (36.7 C) (Temporal)   Resp 16   Ht 6\' 3"  (1.905 m)   Wt 249 lb (112.9 kg)   BMI 31.12 kg/m   Physical Exam Vitals reviewed.  Constitutional:  Appearance: Normal appearance.  HENT:     Head: Normocephalic and atraumatic.     Right Ear: Tympanic membrane normal.     Left Ear: Tympanic membrane normal.  Eyes:     Conjunctiva/sclera: Conjunctivae normal.     Pupils: Pupils are equal, round, and reactive to light.  Cardiovascular:     Rate and Rhythm: Normal rate and regular rhythm.     Pulses: Normal pulses.     Heart sounds: Normal heart sounds.  Pulmonary:     Effort: Pulmonary effort is normal.     Breath sounds: Normal breath sounds.  Musculoskeletal:     Cervical back: Neck supple.  Neurological:     General: No focal deficit present.     Mental Status: He is alert.  Psychiatric:        Mood and Affect: Mood normal.     Assessment/Plan: 1. Situational anxiety Doing very well overall.  Rare  symptomology.  We will continue Xanax for as needed use.  Continue exercise including yoga for stress relief.  Reviewed mindfulness practices.  Recommend downloading mindfulness app on his phone.  Follow-up as needed.  Medication refill sent to pharmacy.  Decreased quantity of 10 as there seems to be an issue with medication expiring before he uses the entire bottle.  Follow-up for CPE in June.  Return sooner if needed.  This visit occurred during the SARS-CoV-2 public health emergency.  Safety protocols were in place, including screening questions prior to the visit, additional usage of staff PPE, and extensive cleaning of exam room while observing appropriate contact time as indicated for disinfecting solutions.     Leeanne Rio, PA-C

## 2019-08-05 NOTE — Patient Instructions (Signed)
It was nice seeing you today! We are happy to have you at our practice!  I have sent in a refill of the Xanax, lower quantity so that you are not having so many tablets expire.   Try to download the HeadSpace app to try out some mindfulness practices/techniques to help with anxiety.  If anything is worsening, please let me know!  As long as things are going well we will see you in June for your physical. Sooner if needed.

## 2019-08-28 DIAGNOSIS — R3121 Asymptomatic microscopic hematuria: Secondary | ICD-10-CM | POA: Diagnosis not present

## 2019-08-28 DIAGNOSIS — R102 Pelvic and perineal pain: Secondary | ICD-10-CM | POA: Diagnosis not present

## 2019-09-18 DIAGNOSIS — H5212 Myopia, left eye: Secondary | ICD-10-CM | POA: Diagnosis not present

## 2019-09-18 DIAGNOSIS — H52223 Regular astigmatism, bilateral: Secondary | ICD-10-CM | POA: Diagnosis not present

## 2019-10-25 ENCOUNTER — Encounter: Payer: 59 | Admitting: Physician Assistant

## 2020-01-14 DIAGNOSIS — Z20822 Contact with and (suspected) exposure to covid-19: Secondary | ICD-10-CM | POA: Diagnosis not present

## 2020-02-28 ENCOUNTER — Other Ambulatory Visit: Payer: Self-pay

## 2020-02-28 ENCOUNTER — Ambulatory Visit (INDEPENDENT_AMBULATORY_CARE_PROVIDER_SITE_OTHER): Payer: Self-pay

## 2020-02-28 DIAGNOSIS — Z23 Encounter for immunization: Secondary | ICD-10-CM

## 2020-04-28 ENCOUNTER — Telehealth: Payer: BC Managed Care – PPO | Admitting: Physician Assistant

## 2020-04-28 DIAGNOSIS — U071 COVID-19: Secondary | ICD-10-CM | POA: Diagnosis not present

## 2020-04-28 DIAGNOSIS — Z20822 Contact with and (suspected) exposure to covid-19: Secondary | ICD-10-CM

## 2020-04-28 LAB — POC COVID19 BINAXNOW: SARS Coronavirus 2 Ag: POSITIVE — AB

## 2020-04-28 NOTE — Progress Notes (Signed)
New Patient Office Visit  Subjective:  Patient ID: Gregory Baker, male    DOB: July 06, 1975  Age: 45 y.o. MRN: [id number]  CC:  Chief Complaint  Patient presents with  . Covid Exposure   Virtual Visit via Telephone Note  I connected with Gregory Baker on 04/28/20 at  5:10 PM EST by telephone and verified that I am speaking with the correct person using two identifiers.  Location: Patient: Home Provider: [facility]    I discussed the limitations, risks, security and privacy concerns of performing an evaluation and management service by telephone and the availability of in person appointments. I also discussed with the patient that there may be a patient responsible charge related to this service. The patient expressed understanding and agreed to proceed.   History of Present Illness: Reports that he had been traveling for the holidays, states that he started having muscle aches and itchy throat on April 23, 2020.  Reports that he used ibuprofen, TheraFlu and emergen-C with relief.  Reports that he is feeling much better, states that his muscle aches have resolved, occasional feeling of a tickle in his throat.     Reports wife had similar symptoms 2 days prior to his.  Reports J [employer] J vaccine for Covid in March 2021.  Observations/Objective: Medical history and current medications reviewed, no physical exam completed       Past Medical History:  Diagnosis Date  . Anxiety     Past Surgical History:  Procedure Laterality Date  . FEMUR FRACTURE SURGERY Right 2011  . FOOT FRACTURE SURGERY Right   . HEMORRHOID SURGERY N/A 08/04/2014   Procedure: HEMORRHOIDECTOMY;  Surgeon: Franky Macho Md, MD;  Location: [facility];  Service: General;  Laterality: N/A;  . MINOR HEMORRHOIDECTOMY      Family History  Problem Relation Age of Onset  . Lung cancer Mother        Smoker  . CAD Neg Hx   . Hypertension Neg Hx     Social History   Socioeconomic  History  . Marital status: Married    Spouse name: Not on file  . Number of children: Not on file  . Years of education: Not on file  . Highest education level: Not on file  Occupational History  . Occupation: Location manager  Tobacco Use  . Smoking status: Never Smoker  . Smokeless tobacco: Never Used  Vaping Use  . Vaping Use: Never used  Substance and Sexual Activity  . Alcohol use: Yes    Alcohol/week: 0.0 standard drinks    Comment: Social  . Drug use: No  . Sexual activity: Yes    Partners: Female    Comment: wife  Other Topics Concern  . 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  HENT: Negative for congestion, sore throat and trouble swallowing.   Eyes: Negative.   Respiratory: Negative for cough.   Gastrointestinal: Negative for abdominal pain, diarrhea, nausea and vomiting.  Endocrine: Negative.   Genitourinary: Negative.   Musculoskeletal: Negative for myalgias.  Skin: Negative.   Allergic/Immunologic: Negative.   Neurological: Negative.  Negative for headaches.  Hematological: Negative.   Psychiatric/Behavioral: Negative.      Objective:   Today's Vitals: There were no vitals taken for this visit.    Assessment & Plan:   Problem List Items Addressed This Visit   None   Visit Diagnoses    COVID-19    -  Primary   Relevant Orders   POC COVID-19 (Completed)      Outpatient Encounter Medications as of 04/28/2020  Medication Sig  . ALPRAZolam (XANAX) 0.5 MG tablet Take 1 tablet (0.5 mg total) by mouth 2 (two) times daily as needed for anxiety.   No facility-administered encounter medications on file as of 04/28/2020.    Assessment and Plan: 1. COVID-19 Patient evaluated, tested and sent home with instructions for home care and quarantine information. Instructed to seek further care if symptoms worsen.    - POC COVID-19   Follow Up Instructions:    I discussed the assessment and treatment plan with the patient. The patient was provided an opportunity to ask questions and all were answered. The patient agreed with the plan and demonstrated an understanding of the instructions.   The patient was advised to call back or seek an in-person evaluation if the symptoms worsen or if the condition fails to improve as anticipated.  I provided 21 minutes of non-face-to-face time during this encounter.   Frankey Botting S Mayers, PA-C  Follow-up: Return if symptoms worsen or fail to improve.   Loraine Grip Mayers, PA-C

## 2020-04-28 NOTE — Progress Notes (Signed)
Patient complains of symptoms beginning 04/24/20. Patient had been skiing and swimming and muscle aches began. Patient had relief once taking ibuprofen and aches returned that evening. Patient denies fever/ N/V and diarrhea. Appetite is still present. Patient has a tickle in his throat. Patient denies any congestion or drainage.

## 2020-04-29 DIAGNOSIS — U071 COVID-19: Secondary | ICD-10-CM | POA: Insufficient documentation

## 2020-04-29 NOTE — Patient Instructions (Signed)
I am glad that you are feeling better, you can find the schedule for the mobile unit by typing in that link or going to [facility] health website and searching mobile medicine, that will show you are calendar.  Please let us know if there is anything else we can do for you  Roney Jaffe, PA-C Physician Assistant [facility] Mobile Medicine [email or web address]    Infection Prevention in the Home If you have an infection, may have been exposed to an infection, or are taking care of someone who has an infection, it is important to know how to keep the infection from spreading. Follow your health care provider's instructions and use these guidelines to help stop the spread of infection. How infections are spread In order for an infection to spread, the following must be present:  A germ. This may be a virus, bacteria, fungus, or parasite.  A place for the germ to live. This may be: ? On or in a person, animal, plant, or food. ? In soil or water. ? On surfaces, such as a door handle.  A person or animal who can develop a disease if the germ enters the body (host). The host does not have resistance to the germ.  A way for the germ to enter the host. This may occur by: ? Direct contact with an infected person or animal. This can happen through shaking hands or hugging. Some germs can also travel through the air and spread to others. This can happen when an infected person coughs or sneezes on or near other people. ? Indirect contact. This occurs when the germ enters the host through contact with an infected object. Examples include:  Eating or drinking food or water that has the germ (is contaminated).  Touching a contaminated surface with your hands, and then touching your face, eyes, nose, or mouth. Supplies needed:  Soap.  Alcohol-based hand sanitizer.  Standard cleaning products.  Disinfectants, such as bleach.  Reusable cleaning cloths,  sponges, or paper towels.  Disposable or reusable utility gloves. How to prevent infection from spreading There are several things that you can do to help prevent infection from spreading. Take these general actions Everyone should take the following actions to prevent the spread of infection:  Wash your hands often with soap and water for at least 20 seconds. If soap and water are not available, use alcohol-based hand sanitizer.  Avoid touching your face, mouth, nose, or eyes.  Cough or sneeze into a tissue, sleeve, or elbow instead of into your hand or into the air. ? If you cough or sneeze into a tissue, throw it away immediately and wash your hands.  Keep your bathroom clean  Provide soap.  Change towels and washcloths frequently.  Change toothbrushes often and store them separately in a clean, dry place.  Clean and disinfect all surfaces, including the toilet, floor, tub, shower, and sink.  Do not share personal items, such as razors, toothbrushes, deodorant, combs, brushes, towels, and washcloths. Maintain hygiene in the [facility] your hands before and after preparing food and before you eat.  Clean the inside of your refrigerator each week.  Keep your refrigerator set at 62F (4C) or less, and set your freezer at 38F (-18C) or less.  Keep work surfaces clean. Disinfect them regularly.  Wash your dishes in hot, soapy water. Air-dry your dishes or use a dishwasher.  Do not share dishes or eating utensils. Handle food safely  Store food carefully.  Refrigerate leftovers promptly in covered containers.  Throw out stale or spoiled food.  Thaw foods in the refrigerator or microwave, not at room temperature.  Serve foods at the proper temperature. Do not eat raw meat. Make sure it is cooked to the appropriate temperature. Cook eggs until they are firm.  Wash fruits and vegetables under running water.  Use separate cutting boards, plates, and utensils for  raw foods and cooked foods.  Use a clean spoon each time you sample food while cooking. Do laundry the right way  Wear gloves if laundry is visibly soiled.  Do not shake soiled laundry. Doing that may send germs into the air.  Wash laundry in hot water.  If you cannot wash the laundry right away, place it in a plastic bag and wash it as soon as possible. Be careful around animals and pets  Wash your hands before and after touching animals.  If you have a pet, ensure that your pet stays clean. Do not let people with weak immune systems touch bird droppings, fish tank water, or a litter box. ? If you have a pet cage or litter box, be sure to clean it every day.  If you are sick, stay away from animals and have someone else care for them if possible. How to clean and disinfect objects and surfaces Precautions  Some disinfectants work for certain germs and not others. Read the manufacturer's instructions or read online resources to determine if the product you are using will work for the germ you are trying to remove.  If you choose to use bleach, use it safely. Never mix it with other cleaning products, especially those that contain ammonia. This mixture can create a dangerous gas that may be deadly.  Keep proper movement of fresh air in your home (ventilation).  Pour used mop water down the utility sink or toilet. Do not pour this water down the kitchen sink. Objects and surfaces   If surfaces are visibly soiled, clean them first with soap and water before disinfecting.  Disinfect surfaces that are frequently touched every day. This may include: ? Counters. ? Tables. ? Doorknobs. ? Sinks and faucets. ? Electronics, such as:  Architectural technologist.  Remote controls.  Keyboards.  Computers and tablets. Cleaning supplies Some cleaning supplies can breed germs. Take good care of them to prevent germs from spreading. To do this:  Soak toilet brushes, mops, and sponges in bleach and  water for 5 minutes after each use, or according to manufacturer's instructions.  Wash reusable cleaning cloths and sanitize sponges after each use.  Throw away disposable gloves after one use.  Replace reusable utility gloves if they are cracked or torn or if they start to peel. Additional actions if you are sick If you live with other people:   Avoid close contact with those around you. Stay at least 3 ft (1 m) away from others, if possible.  Use a separate bathroom, if possible.  If possible, sleep in a separate bedroom or in a separate bed to prevent infecting other household members. ? Change bedroom linens each week or whenever they are soiled.  Have everyone in the household wash hands often with soap and water. If soap and water are not available, use alcohol-based hand sanitizer. In general:  Stay home except to get medical care. Call ahead before visiting your health care provider.  Ask others to get groceries and household supplies and to refill prescriptions for you.  Avoid public areas. Try not  to take public transportation.  If you can, wear a mask if you need to go out of the house, or if you are in close contact with someone who is not sick.  Avoid visitors until you have completely recovered, or until you have no signs and symptoms of infection.  Avoid preparing food or providing care for others. If you must prepare food or provide care for others, wear a mask and wash your hands before and after doing these things. Where to find more information  Centers for Disease Control and Prevention: PromotionalReview.nl  World Health Organization [facility]): PrintStats.tn  Association for Professionals in Infection Control and Epidemiology: professionals.site.StadiumBlog.se Summary  It is important to know how to keep the infection from spreading.  Make sure everyone in  your household washes their hands often with soap and water.  Disinfect surfaces that are frequently touched every day.  If you are sick, stay home except to get medical care. This information is not intended to replace advice given to you by your health care provider. Make sure you discuss any questions you have with your health care provider. Document Released: 01/19/2008 Document Revised: 08/07/2018 Document Reviewed: 07/06/2018 Elsevier Patient Education  [place].

## 2020-05-04 DIAGNOSIS — Z20822 Contact with and (suspected) exposure to covid-19: Secondary | ICD-10-CM | POA: Diagnosis not present

## 2020-05-12 DIAGNOSIS — Z20822 Contact with and (suspected) exposure to covid-19: Secondary | ICD-10-CM | POA: Diagnosis not present

## 2020-05-12 DIAGNOSIS — Z03818 Encounter for observation for suspected exposure to other biological agents ruled out: Secondary | ICD-10-CM | POA: Diagnosis not present

## 2020-11-03 ENCOUNTER — Telehealth: Payer: BC Managed Care – PPO | Admitting: Physician Assistant

## 2020-11-03 DIAGNOSIS — S46811A Strain of other muscles, fascia and tendons at shoulder and upper arm level, right arm, initial encounter: Secondary | ICD-10-CM

## 2020-11-03 MED ORDER — CYCLOBENZAPRINE HCL 10 MG PO TABS
10.0000 mg | ORAL_TABLET | Freq: Three times a day (TID) | ORAL | 0 refills | Status: AC | PRN
Start: 2020-11-03 — End: ?

## 2020-11-03 NOTE — Progress Notes (Signed)
Virtual Visit Consent   Gregory Baker, you are scheduled for a virtual visit with a [facility] provider today.     Just as with appointments in the office, your consent must be obtained to participate.  Your consent will be active for this visit and any virtual visit you may have with one of our providers in the next 365 days.     If you have a MyChart account, a copy of this consent can be sent to you electronically.  All virtual visits are billed to your insurance company just like a traditional visit in the office.    As this is a virtual visit, video technology does not allow for your provider to perform a traditional examination.  This may limit your provider's ability to fully assess your condition.  If your provider identifies any concerns that need to be evaluated in person or the need to arrange testing (such as labs, EKG, etc.), we will make arrangements to do so.     Although advances in technology are sophisticated, we cannot ensure that it will always work on either your end or our end.  If the connection with a video visit is poor, the visit may have to be switched to a telephone visit.  With either a video or telephone visit, we are not always able to ensure that we have a secure connection.     I need to obtain your verbal consent now.   Are you willing to proceed with your visit today?    Gregory Baker has provided verbal consent on 11/03/2020 for a virtual visit (video or telephone).   Leeanne Rio, [place]   Date: 11/03/2020 7:36 AM  Virtual Visit via Video Note   I, Leeanne Rio, connected with  Gregory Baker  ([id number], 1975-12-20) on 11/03/20 at  7:30 AM EDT by a video-enabled telemedicine application and verified that I am speaking with the correct person using two identifiers.  Location: Patient: Virtual Visit Location Patient: Home Provider: Virtual Visit Location Provider: Home Office   I discussed the limitations of evaluation and  management by telemedicine and the availability of in person appointments. The patient expressed understanding and agreed to proceed.    History of Present Illness: Gregory Baker is a 45 y.o. who identifies as a male who was assigned male at birth, and is being seen today for pain and tightness in his R trapezius region over the past 1.5 weeks. Denies any noted trauma or injury. He does note starting a new exercise regimen prior to onset of symptoms. He and family are currently in a corporate apartment until upcoming move to [facility] for his work so he notes the bed is different. Pain is described as a tension and tightness traveling from R neck to T shoulder. Denies numbness, tingling or weakness.  Denies any symptoms of L shoulder. Notes he has taken Ibuprofen in the past day which some slight improvement in symptoms. Has also been getting massages -- not helping other than in the moment. Massage therapist noted a significant knotting in the muscles.    HPI: HPI  Problems:  Patient Active Problem List   Diagnosis Date Noted   COVID-19 04/29/2020    Allergies: No Known Allergies Medications:  Current Outpatient Medications:    cyclobenzaprine (FLEXERIL) 10 MG tablet, Take 1 tablet (10 mg total) by mouth 3 (three) times daily as needed for muscle spasms., Disp: 30 tablet, Rfl: 0   ALPRAZolam (XANAX) 0.5 MG  tablet, Take 1 tablet (0.5 mg total) by mouth 2 (two) times daily as needed for anxiety., Disp: 10 tablet, Rfl: 0  Observations/Objective: Patient is well-developed, well-nourished in no acute distress.  Resting comfortably at home.  Head is normocephalic, atraumatic.  No labored breathing. Speech is clear and coherent with logical content.  Patient is alert and oriented at baseline.   Assessment and Plan: 1. Trapezius strain, right, initial encounter - cyclobenzaprine (FLEXERIL) 10 MG tablet; Take 1 tablet (10 mg total) by mouth 3 (three) times daily as needed for muscle spasms.   Dispense: 30 tablet; Refill: 0 Atraumatic. Suspect overuse causing strain and subsequent spasm, not helped by new sleeping arrangements. Supportive measures, heating pad, stretching discussed. Continue Ibuprofen OTC 600 mg TID PRN for the next 3-4 days. Can alternate with Tylenol. Rx cyclobenzaprine to use in the evenings. Can use up to TID if not driving or operating machinery. Will need in-person follow-up if symptoms are not resolving.   Follow Up Instructions: I discussed the assessment and treatment plan with the patient. The patient was provided an opportunity to ask questions and all were answered. The patient agreed with the plan and demonstrated an understanding of the instructions.  A copy of instructions were sent to the patient via MyChart.  The patient was advised to call back or seek an in-person evaluation if the symptoms worsen or if the condition fails to improve as anticipated.  Time:  I spent 12 minutes with the patient via telehealth technology discussing the above problems/concerns.    Leeanne Rio, PA-C

## 2020-11-03 NOTE — Patient Instructions (Signed)
  Gregory Benedict Mccaul, thank you for joining Leeanne Rio, PA-C for today's virtual visit.  While this provider is not your primary care provider (PCP), if your PCP is located in our provider database this encounter information will be shared with them immediately following your visit.  Consent: (Patient) Gregory Baker provided verbal consent for this virtual visit at the beginning of the encounter.  Current Medications:  Current Outpatient Medications:    ALPRAZolam (XANAX) 0.5 MG tablet, Take 1 tablet (0.5 mg total) by mouth 2 (two) times daily as needed for anxiety., Disp: 10 tablet, Rfl: 0   Medications ordered in this encounter:  No orders of the defined types were placed in this encounter.    *If you need refills on other medications prior to your next appointment, please contact your pharmacy*  Follow-Up: Call back or seek an in-person evaluation if the symptoms worsen or if the condition fails to improve as anticipated.  Other Instructions Please avoid heavy lifting or any repetitive overhead lifting. Hold off on new exercise regimen until we get things resolved. Ok to continue Ibuprofen. You can alternate this with Tylenol to attack pain from two different angles.  I recommend a heating pad to the affected area for 10-15 minutes, a few times per day.  Use the Flexeril (Cyclobenzaprine) in the evening. You can use up to three times daily if not driving or operating heavy machinery as it can make you sleepy.   Feel better and good luck in your new endeavor!!   If you have been instructed to have an in-person evaluation today at a local Urgent Care facility, please use the link below. It will take you to a list of all of our available [facility], including address, phone number and hours of operation. Please do not delay care.  [facility]  If you or a family member do not have a primary care provider, use the link below to schedule a visit  and establish care. When you choose a [facility] primary care physician or advanced practice provider, you gain a long-term partner in health. Find a Primary Care Provider  Learn more about [facility]'s in-office and virtual care options: [facility]

## 2020-11-13 DIAGNOSIS — M542 Cervicalgia: Secondary | ICD-10-CM | POA: Diagnosis not present

## 2020-11-13 DIAGNOSIS — M9905 Segmental and somatic dysfunction of pelvic region: Secondary | ICD-10-CM | POA: Diagnosis not present

## 2020-11-13 DIAGNOSIS — M9903 Segmental and somatic dysfunction of lumbar region: Secondary | ICD-10-CM | POA: Diagnosis not present

## 2020-11-13 DIAGNOSIS — M9901 Segmental and somatic dysfunction of cervical region: Secondary | ICD-10-CM | POA: Diagnosis not present

## 2021-02-01 DIAGNOSIS — J381 Polyp of vocal cord and larynx: Secondary | ICD-10-CM | POA: Insufficient documentation

## 2021-02-01 DIAGNOSIS — R490 Dysphonia: Secondary | ICD-10-CM | POA: Insufficient documentation

## 2021-05-26 ENCOUNTER — Ambulatory Visit (INDEPENDENT_AMBULATORY_CARE_PROVIDER_SITE_OTHER): Payer: Self-pay | Admitting: Internal Medicine

## 2021-06-09 ENCOUNTER — Ambulatory Visit (INDEPENDENT_AMBULATORY_CARE_PROVIDER_SITE_OTHER): Payer: PRIVATE HEALTH INSURANCE | Admitting: Internal Medicine

## 2021-06-09 ENCOUNTER — Other Ambulatory Visit (INDEPENDENT_AMBULATORY_CARE_PROVIDER_SITE_OTHER): Payer: Self-pay | Admitting: Internal Medicine

## 2021-06-09 ENCOUNTER — Encounter (INDEPENDENT_AMBULATORY_CARE_PROVIDER_SITE_OTHER): Payer: Self-pay | Admitting: Internal Medicine

## 2021-06-09 VITALS — BP 149/100 | HR 60 | Temp 97.7°F | Resp 14 | Ht 74.8 in | Wt 253.0 lb

## 2021-06-09 DIAGNOSIS — H6121 Impacted cerumen, right ear: Secondary | ICD-10-CM

## 2021-06-09 DIAGNOSIS — Z1211 Encounter for screening for malignant neoplasm of colon: Secondary | ICD-10-CM

## 2021-06-09 DIAGNOSIS — Z0000 Encounter for general adult medical examination without abnormal findings: Secondary | ICD-10-CM

## 2021-06-09 DIAGNOSIS — R030 Elevated blood-pressure reading, without diagnosis of hypertension: Secondary | ICD-10-CM

## 2021-06-09 DIAGNOSIS — Z23 Encounter for immunization: Secondary | ICD-10-CM

## 2021-06-09 DIAGNOSIS — Z1212 Encounter for screening for malignant neoplasm of rectum: Secondary | ICD-10-CM

## 2021-06-09 DIAGNOSIS — Z1159 Encounter for screening for other viral diseases: Secondary | ICD-10-CM

## 2021-06-09 DIAGNOSIS — R7303 Prediabetes: Secondary | ICD-10-CM | POA: Insufficient documentation

## 2021-06-09 LAB — CBC, DIFF
% Basophils: 0 %
% Eosinophils: 1 %
% Immature Granulocytes: 0 %
% Lymphocytes: 41 %
% Monocytes: 8 %
% Neutrophils: 50 %
% Nucleated RBC: 0 %
Absolute Eosinophil Count: 0.05 10*3/uL (ref 0.00–0.50)
Absolute Lymphocyte Count: 2.3 10*3/uL (ref 1.00–4.80)
Basophils: 0.02 10*3/uL (ref 0.00–0.20)
Hematocrit: 43 % (ref 38.0–50.0)
Hemoglobin: 13.9 g/dL (ref 13.0–18.0)
Immature Granulocytes: 0.01 10*3/uL (ref 0.00–0.05)
MCH: 30.1 pg (ref 27.3–33.6)
MCHC: 32.6 g/dL (ref 32.2–36.5)
MCV: 92 fL (ref 81–98)
Monocytes: 0.46 10*3/uL (ref 0.00–0.80)
Neutrophils: 2.77 10*3/uL (ref 1.80–7.00)
Nucleated RBC: 0 10*3/uL
Platelet Count: 218 10*3/uL (ref 150–400)
RBC: 4.62 10*6/uL (ref 4.40–5.60)
RDW-CV: 11.8 % (ref 11.0–14.5)
WBC: 5.61 10*3/uL (ref 4.3–10.0)

## 2021-06-09 LAB — A1C RAPID, ONSITE: HEMOGLOBIN A1C, ONSITE: 5.9 % (ref 4.0–6.0)

## 2021-06-09 MED ORDER — INFLUENZA VAC SUBUNIT QUAD 0.5 ML IM SUSY
0.5000 mL | PREFILLED_SYRINGE | Freq: Once | INTRAMUSCULAR | Status: AC
Start: 2021-06-09 — End: 2021-06-09
  Administered 2021-06-09: 0.5 mL via INTRAMUSCULAR

## 2021-06-09 NOTE — Progress Notes (Signed)
Reason for visit: Wellness      Cervical screening/PAP: NA   Mammo:  NA   Colon Screen:  Due now, order pended.   Have you seen a specialist since your last visit: NO     If  Name and location and date.     HM Due:   Health Maintenance   Topic Date Due    Hepatitis C Screening  Never done    Hepatitis B Vaccine (1 of 3 - 3-dose series) Never done    Depression Screening (PHQ-2)  Never done    Influenza Vaccine (1) 01/23/2021    COVID-19 Vaccine (1) 02/25/2021    Colorectal Cancer Screening  04/30/2021    Lipid Disorders Screening  10/23/2023    DTaP, Tdap, and Td Vaccines (2 - Td or Tdap) 10/19/2027    HIV Screening  Completed    Hepatitis A Vaccine  Aged Out    Pneumococcal Vaccine: Pediatrics (0-5 years) and At-Risk Patients (6-64 years)  Aged Out              No future appointments.    Ear Lavage Procedure Documentation:   After written order from provider, I irrigated the patient's ear(s) using the [facility] system and body temperature (warm) water.    Which side did you treat?Right-sided    Did you put a softening agent in? (if yes, indicate the agent used): Yes, Yes     Did you visualize the eardrum afterwards? yes     Did patient tolerate the procedure well? (if no, indicate any problems): Yes    Vaccine Screening Questions    Interpreter: No    1. Are you allergic to Latex? NO    2.  Have you had a serious reaction or an allergic reaction to a vaccine?  NO    3.  Currently have a moderate or severe illness, including fever?  NO    4.  Ever had a seizure or any neurological problem associated with a vaccine? (DTaP/TDaP/DTP pertinent) NO    5.  Is patient receiving any live vaccinations today? (Varicella-Chickenpox, MMR-Measles/Mumps/Rubella, Zoster-Shingles, Flumist, Yellow Fever) NOTE: oral rotavirus is exempt  NO    If YES to any of the questions above - Do NOT give vaccine.  Consult with RN or provider in clinic.  (#5 can be YES if all Live vaccine questions are answered NO)    If NO to  all questions above - Patient may receive vaccine.    6. Are you pregnant or is there a chance you could become pregnant during the next month (HPV pertinent) NO    7. Do you need to receive the Flu vaccine today? YES - Additional Flu Questions  Flu Vaccine Screening Questions:    Ever had a serious allergic reaction to eggs?  NO    Ever had Guillain-Barre syndrome associated with a vaccine? NO    Less than 6 months old? NO    If YES to any of the Flu questions above - NO Flu Vaccine to be given.  Patient may consult provider as needed.    If NO to all questions above - Patient may receive Flu Shot (IM)    Is the patient requesting Flumist? NO    If between 6 months and 23 years of age, was flu vaccine received last year?  N/A  If NO to above question:   Children who are receiving influenza vaccine for the first time - administer 2 doses of the  current influenza vaccine (separated by at least 4 weeks).        All patients are encouraged to wait 15 minutes before leaving after receiving any vaccine.    VIS given 06/09/2021 by Lenard Forth, CMA.      Vaccine given today without initial adverse effect. YES    Lenard Forth, CMA

## 2021-06-09 NOTE — Progress Notes (Signed)
SUBJECTIVE:    Derrick Nielsen is a 46 year old male here for a wellness/preventive health care visit.      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 Improved with therapy. Still deciding if he would like to have the polyp removed    SEXUAL HISTORY  Sexual activity: yes  Number of sex partners in the past year: 1  History of STD: none known  Sexual concerns: No  HIV screening offered: negative 10/18/2017  HPV vaccine: N/A  Safety concerns: No    CANCER SCREENING     Family History     Problem (# of Occurrences) Relation (Name,Age of Onset)    Lung Cancer (2) Mother: smoker, Maternal Grandmother: snoker        Family history of colon cancer: NO  Family history of other cancers: NO    LIFESTYLE  Current dietary habits: healthy diet in general  Current exercise habits: yes, engages in regular exercise  Regular seat belt use: YES  Discussed helmet use and no texting while driving.Yes  Guns in the house: No / discussed safe storage   reports that he has never smoked. He has never used smokeless tobacco. He reports current alcohol use. He reports that he does not use drugs.    History sections of chart reviewed and updated today: yes    REVIEW OF SYSTEMS:  REGULAR DENTAL EXAMS: yes  Review of Systems   Constitutional: Negative for fever, malaise/fatigue, weight gain and weight loss.   HENT: Negative for congestion and sore throat.         History of voice change   Eyes: Negative for visual disturbance.   Cardiovascular: Negative for chest pain and leg swelling.   Respiratory: Negative for cough and shortness of breath.    Skin: Negative for rash.   Musculoskeletal: Negative for back pain, joint pain, joint swelling and muscle weakness.   Gastrointestinal: Negative for abdominal pain, constipation, diarrhea, nausea and vomiting.   Neurological: Negative for dizziness, headaches, numbness and  paresthesias.   Psychiatric/Behavioral: Negative for substance abuse. The patient does not have insomnia.      PHYSICAL EXAM:  BP (!) 149/100    Pulse 60    Temp 36.5 C (Temporal)    Resp 14    Ht 6' 2.8" (1.9 m)    Wt (!) 114.8 kg (253 lb)    SpO2 98%    BMI 31.79 kg/m    Physical Exam  Constitutional:       General: He is not in acute distress.     Appearance: Normal appearance.   HENT:      Head: Normocephalic.      Right Ear: External ear normal. There is impacted cerumen.      Left Ear: External ear normal.   Eyes:      Conjunctiva/sclera: Conjunctivae normal.   Cardiovascular:      Rate and Rhythm: Normal rate and regular rhythm.   Pulmonary:      Effort: Pulmonary effort is normal.   Abdominal:      General: There is no distension.   Musculoskeletal:         General: No swelling or tenderness. Normal range of motion.   Skin:     General: Skin is warm and dry.   Neurological:      General:  No focal deficit present.      Mental Status: He is alert and oriented to person, place, and time.      Motor: No weakness.      Gait: Gait normal.   Psychiatric:         Mood and Affect: Mood normal.         Behavior: Behavior normal.         Thought Content: Thought content normal.         Judgment: Judgment normal.        -----------------------------------------    ASSESSMENT AND PLAN:  Mr Fosdick presents today to establish care and for a wellness visit    1. Encounter for general adult medical examination without abnormal findings  2. Laboratory exam ordered as part of routine general medical examination  He is doing well. Discussed cancer screening  He would like to proceed with colonoscopy which was ordered today  He will follow-up with ENT as needed for his vocal cord issues  - CBC with Diff; Future  - Comprehensive Metabolic Panel; Future  - Lipid Panel; Future  - POC Whole Blood A1C    3. Screening for colorectal cancer  - Colonoscopy - Asymptomatic Screening; Future    4. Need for influenza vaccination  -  influenza quadrivalent PF MDCK vaccine (Flucelvax) syringe 0.5 mL    5. Impacted cerumen of right ear  Removed in clinic today    6. Need for hepatitis C screening test  Declined screening    7. Elevated blood pressure reading  Discussed lifestyle measures to manage blood pressure  I have advised the following: Healthy diet with lots of whole grains, low-salt, and adequate hydration.  Minimize alcohol and stimulants as needed, exercise regularly and try to lose weight if needed.  Encouraged checking blood pressure at home       Preventive counseling: healthy dietary guidelines, proper exercise, age appropriate cancer screening    Follow-up: Return in about 6 months (around 12/07/2021) for blood pressure.      Carley Hammed, MD, PhD  Internal Medicine-Pediatrics  [facility]  06/09/21; 3:04 PM

## 2021-06-09 NOTE — Patient Instructions (Signed)
For elevated blood pressure, I advise the following:  Try to stick to a healthy diet with lots of whole grains, low-salt, and adequate hydration.  Minimize alcohol and stimulants as needed, exercise regularly and try to lose weight if needed.    Instructions on checking BP at home  Make sure you are seated at least 5 minutes, relaxed, both feet on the ground and legs uncrossed.   Make sure you arm is supported. Upper arm cuff is most accurate  Empty bladder  Bring log to next clinic visit/send log via MyChart       Regarding any Lab, pathology or radiology results:  If your results are NORMAL, I will be sending you a letter through mail or the electronic patient portal. Sometimes there will be results that are just slightly out of range and do not need further investigation.     If there are ABNORMAL results that require action, you will get phone call from my assistant/nurse relaying my message to you or if you have portal access the discussion will be initiated through the portal. You may need a follow up clinic visit to discuss further.    If you have not heard from us in 2 weeks, please call us.      Leading a Healthy Life  Six tips to help improve your health and wellness      This explains how these 6 basic guidelines may improve your health and wellness:   Eat well to give your body the energy it needs.   Stay or get active.   A healthy mind is part of a healthy body.   Practice safe living habits.   Keep your mind and body free of harmful drugs and alcohol.   Get regular health care.      Tip #1: Eat well to give your body the energy it needs.   Your body needs nutritious foods to stay strong and healthy.   Here are some general eating guidelines:   Have 2 servings of fish or other seafood 2 times a week (1 serving = 4 ounces).   If you eat dairy products, choose low-fat (1%) or nonfat ones.   If you eat meat, cut down on the amount. Replace it with plant-based foods such as beans, whole grains, fruits and  vegetables, and nuts and seeds.   Have less than 1,500 mg of sodium (salt) a day.   Cut down on "junk food" like alcohol, fatty foods, chips, candy, and other sweets.      Tip #2: Stay or get active.   Exercise for at least 30 minutes at a time, 5 times a week. Regular physical activity can help you:   Live longer and feel better   Be stronger and more flexible   Build strong bones   Prevent depression   Strengthen your immune system   Maintain a healthy body weight      Tip #3: Remember: A healthy mind is part of a healthy body.   A good state of mind can help you make healthy choices. Here are a few tips for keeping your mind healthy:   Reduce stress in your life.   Make some time every day for things that are fun.   Get enough sleep. Lack of sleep reduces how well you can concentrate, increases mood swings, and raises your risk of having a car accident.   Ask your health care provider for help if you feel depressed or anxious for more   than several days in a row.      Tip #4: Practice safe living habits.   Accidents and Injuries      Accidents and injuries are the 5th leading cause of death in the [place]   Accidents in the home cause thousands of permanent injuries every year.      The most common accidents are fires, falls, and drowning. To help yourself and your family stay safe:   Install smoke detectors on each floor of your home.   Make sure everyone in your family knows how to swim  Stay safe on the road:  Wear a seatbelt.   Do not ride with someone who has been drinking or taking drugs.   Do not speak on a cell phone or send, read, or write text messages while you are driving.   Wear a helmet when you ride a bicycle or motorcycle.   Get enough sleep at night, and do not drive when you are tired.      Hand Hygiene   Protect yourself from germs by washing your hands often. Always wash your hands:   After you change a diaper or use the toilet   Before you start and after you finish preparing food      Tip #5:  Keep your mind and body free of harmful drugs and alcohol.   Tobacco causes more health problems than any other substance. These problems include lung disease, heart disease, and many types of cancer. The nicotine in tobacco is the most addictive and widely used drug.   Too much alcohol can cause damage to your liver, heart, brain, bones, and other body tissues. Being under the influence of alcohol also increases your chance of being injured in an accident.     Alcohol can cause fetal alcohol syndrome in your children if you drink regularly when you are pregnant.   Street drugs, like marijuana, cocaine, methamphetamine, heroin, or pain pills not prescribed by your doctor can harm your health. They may be mixed with harmful substances, and using them can cause people to put themselves in dangerous situations.      Tip #6: Get regular health care.   Many people think they need to see the doctor only when they are sick. But, health care providers can also help you stay healthy.   Find a health care provider who works with you to manage your health.   Ask your health care provider what diseases you are at risk for. Learn what you can do to prevent or control them.   Get yourself and your family immunized against life-threatening diseases.

## 2021-06-10 LAB — COMPREHENSIVE METABOLIC PANEL
ALT (GPT): 20 U/L (ref 10–64)
AST (GOT): 23 U/L (ref 9–38)
Albumin: 4.5 g/dL (ref 3.5–5.2)
Alkaline Phosphatase (Total): 52 U/L (ref 39–139)
Anion Gap: 9 (ref 4–12)
Bilirubin (Total): 0.7 mg/dL (ref 0.2–1.3)
Calcium: 9.6 mg/dL (ref 8.9–10.2)
Carbon Dioxide, Total: 28 meq/L (ref 22–32)
Chloride: 102 meq/L (ref 98–108)
Creatinine: 1.27 mg/dL — ABNORMAL HIGH (ref 0.51–1.18)
Glucose: 75 mg/dL (ref 62–125)
Potassium: 4.5 meq/L (ref 3.6–5.2)
Protein (Total): 6.9 g/dL (ref 6.0–8.2)
Sodium: 139 meq/L (ref 135–145)
Urea Nitrogen: 12 mg/dL (ref 8–21)
eGFR by CKD-EPI 2021: >60 mL/min/{1.73_m2} (ref >59)

## 2021-06-10 LAB — LIPID PANEL
Cholesterol/HDL Ratio: 3
HDL Cholesterol: 53 mg/dL (ref >39)
LDL Cholesterol, NIH Equation: 95 mg/dL (ref <130)
Non-HDL Cholesterol: 105 mg/dL (ref 0–159)
Total Cholesterol: 158 mg/dL (ref <200)
Triglyceride: 50 mg/dL (ref <150)

## 2021-06-14 ENCOUNTER — Encounter (INDEPENDENT_AMBULATORY_CARE_PROVIDER_SITE_OTHER): Payer: Self-pay | Admitting: Internal Medicine

## 2021-06-14 ENCOUNTER — Other Ambulatory Visit (INDEPENDENT_AMBULATORY_CARE_PROVIDER_SITE_OTHER): Payer: Self-pay | Admitting: Internal Medicine

## 2021-06-14 DIAGNOSIS — R7989 Other specified abnormal findings of blood chemistry: Secondary | ICD-10-CM

## 2021-06-14 NOTE — Result Encounter Note (Signed)
Result reviewed. MyChart Message sent 06/14/21

## 2021-06-15 NOTE — Telephone Encounter (Signed)
Copied question and pasted into additional MyChart message sent by patient regarding lab results and routed to provider.

## 2021-06-15 NOTE — Telephone Encounter (Addendum)
Additional question received in separate MyChart message copied below:     Hello!  What is my GFR? What would you suggest I do to lowering my creatinine to a normal level?    Routing to provider for interpretation.

## 2021-07-18 ENCOUNTER — Other Ambulatory Visit: Payer: Self-pay

## 2022-02-02 ENCOUNTER — Other Ambulatory Visit: Payer: Self-pay

## 2022-05-24 ENCOUNTER — Other Ambulatory Visit: Payer: Self-pay

## 2022-05-31 ENCOUNTER — Other Ambulatory Visit: Payer: Self-pay

## 2022-05-31 ENCOUNTER — Encounter (INDEPENDENT_AMBULATORY_CARE_PROVIDER_SITE_OTHER): Payer: Self-pay

## 2022-06-07 ENCOUNTER — Other Ambulatory Visit: Payer: Self-pay

## 2022-06-23 ENCOUNTER — Encounter: Payer: Self-pay | Admitting: Radiology

## 2022-08-30 ENCOUNTER — Other Ambulatory Visit: Payer: Self-pay | Admitting: Gastroenterology

## 2022-08-30 DIAGNOSIS — Z1211 Encounter for screening for malignant neoplasm of colon: Secondary | ICD-10-CM

## 2022-11-24 ENCOUNTER — Telehealth: Payer: Self-pay

## 2022-11-24 NOTE — Telephone Encounter (Signed)
Attempted reminder message #3 for [facility] FIT kit outreach and receipt: voicemail full; unable to leave message.

## 2023-02-07 ENCOUNTER — Other Ambulatory Visit: Payer: Self-pay

## 2024-02-29 ENCOUNTER — Encounter (INDEPENDENT_AMBULATORY_CARE_PROVIDER_SITE_OTHER): Payer: Self-pay | Admitting: Internal Medicine

## 2024-05-14 ENCOUNTER — Other Ambulatory Visit: Payer: Self-pay | Admitting: Gastroenterology

## 2024-05-14 ENCOUNTER — Encounter (INDEPENDENT_AMBULATORY_CARE_PROVIDER_SITE_OTHER): Payer: Self-pay | Admitting: Internal Medicine

## 2024-05-14 DIAGNOSIS — Z1212 Encounter for screening for malignant neoplasm of rectum: Secondary | ICD-10-CM
